# Patient Record
Sex: Female | Born: 1935 | Race: White | Hispanic: No | Marital: Married | Smoking: Never smoker
Health system: Southern US, Community
[De-identification: ages and names within clinical notes are randomized; demographics above are authoritative.]

## PROBLEM LIST (undated history)

## (undated) DIAGNOSIS — I639 Cerebral infarction, unspecified: Secondary | ICD-10-CM

## (undated) DIAGNOSIS — I480 Paroxysmal atrial fibrillation: Secondary | ICD-10-CM

## (undated) DIAGNOSIS — N390 Urinary tract infection, site not specified: Secondary | ICD-10-CM

## (undated) DIAGNOSIS — E785 Hyperlipidemia, unspecified: Secondary | ICD-10-CM

## (undated) DIAGNOSIS — E236 Other disorders of pituitary gland: Secondary | ICD-10-CM

## (undated) DIAGNOSIS — T7840XA Allergy, unspecified, initial encounter: Secondary | ICD-10-CM

## (undated) DIAGNOSIS — G919 Hydrocephalus, unspecified: Secondary | ICD-10-CM

## (undated) DIAGNOSIS — M1990 Unspecified osteoarthritis, unspecified site: Secondary | ICD-10-CM

## (undated) DIAGNOSIS — Z8673 Personal history of transient ischemic attack (TIA), and cerebral infarction without residual deficits: Secondary | ICD-10-CM

## (undated) DIAGNOSIS — F0390 Unspecified dementia without behavioral disturbance: Secondary | ICD-10-CM

## (undated) HISTORY — DX: Unspecified osteoarthritis, unspecified site: M19.90

## (undated) HISTORY — DX: Hyperlipidemia, unspecified: E78.5

## (undated) HISTORY — DX: Hydrocephalus, unspecified: G91.9

## (undated) HISTORY — DX: Personal history of transient ischemic attack (TIA), and cerebral infarction without residual deficits: Z86.73

## (undated) HISTORY — PX: PATELLA FRACTURE SURGERY: SHX735

## (undated) HISTORY — PX: SPINE SURGERY: SHX786

## (undated) HISTORY — DX: Urinary tract infection, site not specified: N39.0

## (undated) HISTORY — PX: TIBIA FRACTURE SURGERY: SHX806

## (undated) HISTORY — DX: Allergy, unspecified, initial encounter: T78.40XA

## (undated) HISTORY — DX: Other disorders of pituitary gland: E23.6

## (undated) HISTORY — DX: Unspecified dementia, unspecified severity, without behavioral disturbance, psychotic disturbance, mood disturbance, and anxiety: F03.90

## (undated) HISTORY — DX: Cerebral infarction, unspecified: I63.9

## (undated) HISTORY — PX: HUMERUS SURGERY: SHX672

---

## 2008-09-07 ENCOUNTER — Inpatient Hospital Stay: Payer: Self-pay | Admitting: Orthopedic Surgery

## 2008-09-24 ENCOUNTER — Emergency Department: Payer: Self-pay | Admitting: Unknown Physician Specialty

## 2010-08-13 ENCOUNTER — Emergency Department: Payer: Self-pay | Admitting: Emergency Medicine

## 2010-10-19 ENCOUNTER — Ambulatory Visit: Payer: Self-pay | Admitting: Family Medicine

## 2010-12-17 ENCOUNTER — Ambulatory Visit: Payer: Self-pay

## 2011-05-12 ENCOUNTER — Ambulatory Visit: Payer: Self-pay

## 2011-05-12 LAB — CREATININE, SERUM
Creatinine: 0.9 mg/dL (ref 0.60–1.30)
EGFR (African American): >60
EGFR (Non-African Amer.): >60

## 2011-12-28 ENCOUNTER — Emergency Department: Payer: Self-pay

## 2011-12-28 LAB — URINALYSIS, COMPLETE
Hyaline Cast: 36
Leukocyte Esterase: NEGATIVE
Nitrite: NEGATIVE
Protein: 30
WBC UR: 8 /HPF (ref 0–5)

## 2011-12-28 LAB — COMPREHENSIVE METABOLIC PANEL
Bilirubin,Total: 1.2 mg/dL — ABNORMAL HIGH (ref 0.2–1.0)
Calcium, Total: 8.6 mg/dL (ref 8.5–10.1)
Chloride: 107 mmol/L (ref 98–107)
Co2: 26 mmol/L (ref 21–32)
Creatinine: 0.94 mg/dL (ref 0.60–1.30)
EGFR (African American): >60
EGFR (Non-African Amer.): 57 — ABNORMAL LOW
SGOT(AST): 24 U/L (ref 15–37)
SGPT (ALT): 29 U/L (ref 12–78)

## 2011-12-28 LAB — AMMONIA: Ammonia, Plasma: <25 mcmol/L (ref 11–32)

## 2011-12-28 LAB — CBC
HCT: 44.7 % (ref 35.0–47.0)
HGB: 15 g/dL (ref 12.0–16.0)
MCH: 30.2 pg (ref 26.0–34.0)
MCHC: 33.6 g/dL (ref 32.0–36.0)
MCV: 90 fL (ref 80–100)
Platelet: 159 10*3/uL (ref 150–440)

## 2011-12-28 LAB — CK TOTAL AND CKMB (NOT AT ARMC): CK, Total: 163 U/L (ref 21–215)

## 2011-12-30 LAB — URINE CULTURE

## 2012-01-02 LAB — CULTURE, BLOOD (SINGLE)

## 2012-05-24 ENCOUNTER — Ambulatory Visit: Payer: Self-pay | Admitting: Orthopedic Surgery

## 2012-05-31 ENCOUNTER — Ambulatory Visit: Payer: Self-pay | Admitting: Orthopedic Surgery

## 2012-09-12 ENCOUNTER — Emergency Department: Payer: Self-pay | Admitting: Internal Medicine

## 2012-09-12 LAB — CBC WITH DIFFERENTIAL/PLATELET
Basophil #: 0.1 x10 3/mm 3
Basophil %: 1.2 %
Eosinophil #: 0.2 x10 3/mm 3
Eosinophil %: 2.2 %
HCT: 38.5 %
HGB: 13.1 g/dL
Lymphocyte %: 43.5 %
Lymphs Abs: 3.5 x10 3/mm 3
MCH: 29.5 pg
MCHC: 33.9 g/dL
MCV: 87 fL
Monocyte #: 0.9 "x10 3/mm "
Monocyte %: 10.8 %
Neutrophil #: 3.4 x10 3/mm 3
Neutrophil %: 42.3 %
Platelet: 177 x10 3/mm 3
RBC: 4.42 X10 6/mm 3
RDW: 14.2 %
WBC: 8 x10 3/mm 3

## 2012-09-12 LAB — COMPREHENSIVE METABOLIC PANEL
Anion Gap: 7 (ref 7–16)
BUN: 15 mg/dL (ref 7–18)
Bilirubin,Total: 1 mg/dL (ref 0.2–1.0)
Chloride: 108 mmol/L — ABNORMAL HIGH (ref 98–107)
Co2: 26 mmol/L (ref 21–32)
Creatinine: 0.99 mg/dL (ref 0.60–1.30)
EGFR (African American): >60
EGFR (Non-African Amer.): 53 — ABNORMAL LOW
Glucose: 130 mg/dL — ABNORMAL HIGH (ref 65–99)
Osmolality: 284 (ref 275–301)
SGPT (ALT): 20 U/L (ref 12–78)
Sodium: 141 mmol/L (ref 136–145)
Total Protein: 6.4 g/dL (ref 6.4–8.2)

## 2012-09-12 LAB — CK TOTAL AND CKMB (NOT AT ARMC): CK, Total: 30 U/L (ref 21–215)

## 2012-09-12 LAB — TROPONIN I
Troponin-I: <0.02 ng/mL
Troponin-I: <0.02 ng/mL

## 2012-09-12 LAB — TSH: Thyroid Stimulating Horm: <0.01 u[IU]/mL — ABNORMAL LOW

## 2012-09-12 LAB — T4, FREE: Free Thyroxine: 1.59 ng/dL — ABNORMAL HIGH

## 2012-11-27 ENCOUNTER — Emergency Department: Payer: Self-pay | Admitting: Emergency Medicine

## 2012-11-27 LAB — BASIC METABOLIC PANEL
Anion Gap: 6 — ABNORMAL LOW (ref 7–16)
BUN: 19 mg/dL — ABNORMAL HIGH (ref 7–18)
Co2: 27 mmol/L (ref 21–32)
Creatinine: 0.82 mg/dL (ref 0.60–1.30)
Osmolality: 280 (ref 275–301)
Potassium: 3.3 mmol/L — ABNORMAL LOW (ref 3.5–5.1)

## 2012-11-27 LAB — CBC
HCT: 39.1 % (ref 35.0–47.0)
MCHC: 34.4 g/dL (ref 32.0–36.0)
MCV: 87 fL (ref 80–100)
Platelet: 168 10*3/uL (ref 150–440)
RBC: 4.49 10*6/uL (ref 3.80–5.20)
RDW: 14.6 % — ABNORMAL HIGH (ref 11.5–14.5)

## 2012-11-27 LAB — URINALYSIS, COMPLETE
Bacteria: NONE SEEN
Glucose,UR: NEGATIVE mg/dL (ref 0–75)
Ketone: NEGATIVE
Ph: 6 (ref 4.5–8.0)
Protein: NEGATIVE
RBC,UR: 4 /HPF (ref 0–5)
Squamous Epithelial: 1

## 2014-02-18 ENCOUNTER — Ambulatory Visit: Payer: Self-pay

## 2014-02-25 DIAGNOSIS — F0280 Dementia in other diseases classified elsewhere without behavioral disturbance: Secondary | ICD-10-CM | POA: Insufficient documentation

## 2014-02-25 DIAGNOSIS — E039 Hypothyroidism, unspecified: Secondary | ICD-10-CM | POA: Insufficient documentation

## 2014-02-25 DIAGNOSIS — E230 Hypopituitarism: Secondary | ICD-10-CM | POA: Insufficient documentation

## 2014-02-25 DIAGNOSIS — I9589 Other hypotension: Secondary | ICD-10-CM | POA: Insufficient documentation

## 2014-02-25 DIAGNOSIS — G309 Alzheimer's disease, unspecified: Secondary | ICD-10-CM

## 2014-05-06 ENCOUNTER — Inpatient Hospital Stay
Admit: 2014-05-06 | Disposition: A | Discharge status: Home / Self Care | Payer: Self-pay | Attending: Internal Medicine | Admitting: Internal Medicine

## 2014-05-06 ENCOUNTER — Other Ambulatory Visit: Payer: Self-pay | Admitting: Physician Assistant

## 2014-05-06 DIAGNOSIS — R4182 Altered mental status, unspecified: Secondary | ICD-10-CM

## 2014-05-06 DIAGNOSIS — I4891 Unspecified atrial fibrillation: Secondary | ICD-10-CM

## 2014-05-06 DIAGNOSIS — I517 Cardiomegaly: Secondary | ICD-10-CM | POA: Diagnosis not present

## 2014-05-06 LAB — URINALYSIS, COMPLETE
BLOOD: NEGATIVE
Bacteria: NONE SEEN
Bilirubin,UR: NEGATIVE
Glucose,UR: NEGATIVE mg/dL (ref 0–75)
Leukocyte Esterase: NEGATIVE
Nitrite: NEGATIVE
PH: 6 (ref 4.5–8.0)
Protein: 30
Specific Gravity: 1.015 (ref 1.003–1.030)

## 2014-05-06 LAB — BASIC METABOLIC PANEL
Anion Gap: 12 (ref 7–16)
BUN: 10 mg/dL
CHLORIDE: 103 mmol/L
Calcium, Total: 9 mg/dL
Co2: 20 mmol/L — ABNORMAL LOW
Creatinine: 0.95 mg/dL
EGFR (Non-African Amer.): 56 — ABNORMAL LOW
GLUCOSE: 106 mg/dL — AB
POTASSIUM: 3.3 mmol/L — AB
Sodium: 135 mmol/L

## 2014-05-06 LAB — CK TOTAL AND CKMB (NOT AT ARMC)
CK, Total: 18 U/L — ABNORMAL LOW
CK, Total: 34 U/L — ABNORMAL LOW
CK-MB: 2.3 ng/mL
CK-MB: 2.9 ng/mL

## 2014-05-06 LAB — CBC
HCT: 40.5 % (ref 35.0–47.0)
HGB: 12.7 g/dL (ref 12.0–16.0)
MCH: 26.2 pg (ref 26.0–34.0)
MCHC: 31.4 g/dL — AB (ref 32.0–36.0)
MCV: 83 fL (ref 80–100)
Platelet: 381 10*3/uL (ref 150–440)
RBC: 4.85 10*6/uL (ref 3.80–5.20)
RDW: 18.4 % — AB (ref 11.5–14.5)
WBC: 9.1 10*3/uL (ref 3.6–11.0)

## 2014-05-06 LAB — HEPATIC FUNCTION PANEL A (ARMC)
ALT: 12 U/L — AB
Albumin: 3.4 g/dL — ABNORMAL LOW
Alkaline Phosphatase: 122 U/L
BILIRUBIN INDIRECT: 0.6
BILIRUBIN TOTAL: 0.8 mg/dL
Bilirubin, Direct: 0.2 mg/dL
SGOT(AST): 27 U/L
Total Protein: 6.7 g/dL

## 2014-05-06 LAB — TROPONIN I
Troponin-I: <0.03 ng/mL
Troponin-I: <0.03 ng/mL
Troponin-I: <0.03 ng/mL

## 2014-05-07 ENCOUNTER — Encounter: Payer: Self-pay | Admitting: Physician Assistant

## 2014-05-07 DIAGNOSIS — I4891 Unspecified atrial fibrillation: Secondary | ICD-10-CM | POA: Diagnosis not present

## 2014-05-07 DIAGNOSIS — R079 Chest pain, unspecified: Secondary | ICD-10-CM | POA: Diagnosis not present

## 2014-05-07 LAB — CBC WITH DIFFERENTIAL/PLATELET
BASOS ABS: 0.1 10*3/uL (ref 0.0–0.1)
Basophil %: 0.9 %
Eosinophil #: 0 10*3/uL (ref 0.0–0.7)
Eosinophil %: 0.6 %
HCT: 29.9 % — AB (ref 35.0–47.0)
HGB: 9.4 g/dL — ABNORMAL LOW (ref 12.0–16.0)
Lymphocyte #: 1.6 10*3/uL (ref 1.0–3.6)
Lymphocyte %: 22.5 %
MCH: 26.6 pg (ref 26.0–34.0)
MCHC: 31.6 g/dL — AB (ref 32.0–36.0)
MCV: 84 fL (ref 80–100)
Monocyte #: 0.6 x10 3/mm (ref 0.2–0.9)
Monocyte %: 8.8 %
Neutrophil #: 4.8 10*3/uL (ref 1.4–6.5)
Neutrophil %: 67.2 %
Platelet: 291 10*3/uL (ref 150–440)
RBC: 3.55 10*6/uL — ABNORMAL LOW (ref 3.80–5.20)
RDW: 18.2 % — ABNORMAL HIGH (ref 11.5–14.5)
WBC: 7.2 10*3/uL (ref 3.6–11.0)

## 2014-05-07 LAB — MAGNESIUM: Magnesium: 1.7 mg/dL

## 2014-05-07 LAB — BASIC METABOLIC PANEL
ANION GAP: 3 — AB (ref 7–16)
BUN: 6 mg/dL
CHLORIDE: 111 mmol/L
Calcium, Total: 7.8 mg/dL — ABNORMAL LOW
Co2: 21 mmol/L — ABNORMAL LOW
Creatinine: 0.45 mg/dL
EGFR (African American): >60
EGFR (Non-African Amer.): >60
GLUCOSE: 106 mg/dL — AB
POTASSIUM: 4.1 mmol/L
Sodium: 135 mmol/L

## 2014-05-07 LAB — TSH: Thyroid Stimulating Horm: 0.026 u[IU]/mL — ABNORMAL LOW

## 2014-05-07 LAB — LIPID PANEL
Cholesterol: 106 mg/dL
HDL: 29 mg/dL — AB
LDL CHOLESTEROL, CALC: 55 mg/dL
Triglycerides: 109 mg/dL
VLDL CHOLESTEROL, CALC: 22 mg/dL

## 2014-05-07 LAB — T4, FREE: FREE THYROXINE: 0.78 ng/dL

## 2014-05-08 DIAGNOSIS — I4891 Unspecified atrial fibrillation: Secondary | ICD-10-CM | POA: Diagnosis not present

## 2014-05-08 LAB — CBC WITH DIFFERENTIAL/PLATELET
BASOS ABS: 0 10*3/uL (ref 0.0–0.1)
BASOS PCT: 0.4 %
EOS PCT: 0.4 %
Eosinophil #: 0 10*3/uL (ref 0.0–0.7)
HCT: 29.1 % — ABNORMAL LOW (ref 35.0–47.0)
HGB: 9.5 g/dL — AB (ref 12.0–16.0)
LYMPHS ABS: 1.7 10*3/uL (ref 1.0–3.6)
Lymphocyte %: 21.3 %
MCH: 27 pg (ref 26.0–34.0)
MCHC: 32.6 g/dL (ref 32.0–36.0)
MCV: 83 fL (ref 80–100)
MONOS PCT: 5.9 %
Monocyte #: 0.5 x10 3/mm (ref 0.2–0.9)
NEUTROS ABS: 5.6 10*3/uL (ref 1.4–6.5)
Neutrophil %: 72 %
Platelet: 301 10*3/uL (ref 150–440)
RBC: 3.52 10*6/uL — ABNORMAL LOW (ref 3.80–5.20)
RDW: 18 % — AB (ref 11.5–14.5)
WBC: 7.8 10*3/uL (ref 3.6–11.0)

## 2014-05-08 LAB — BASIC METABOLIC PANEL
ANION GAP: 8 (ref 7–16)
BUN: 5 mg/dL — AB
CHLORIDE: 108 mmol/L
Calcium, Total: 8.1 mg/dL — ABNORMAL LOW
Co2: 20 mmol/L — ABNORMAL LOW
Creatinine: 0.41 mg/dL — ABNORMAL LOW
EGFR (Non-African Amer.): >60
Glucose: 109 mg/dL — ABNORMAL HIGH
POTASSIUM: 3.8 mmol/L
Sodium: 136 mmol/L

## 2014-05-08 LAB — MAGNESIUM: Magnesium: 1.6 mg/dL — ABNORMAL LOW

## 2014-05-08 LAB — URINE CULTURE

## 2014-05-09 LAB — CBC WITH DIFFERENTIAL/PLATELET
BASOS PCT: 0.5 %
Basophil #: 0 10*3/uL (ref 0.0–0.1)
EOS PCT: 1.1 %
Eosinophil #: 0.1 10*3/uL (ref 0.0–0.7)
HCT: 31.4 % — ABNORMAL LOW (ref 35.0–47.0)
HGB: 10 g/dL — AB (ref 12.0–16.0)
LYMPHS PCT: 28.4 %
Lymphocyte #: 2.4 10*3/uL (ref 1.0–3.6)
MCH: 26.4 pg (ref 26.0–34.0)
MCHC: 32 g/dL (ref 32.0–36.0)
MCV: 83 fL (ref 80–100)
MONOS PCT: 8.9 %
Monocyte #: 0.8 x10 3/mm (ref 0.2–0.9)
NEUTROS PCT: 61.1 %
Neutrophil #: 5.2 10*3/uL (ref 1.4–6.5)
PLATELETS: 342 10*3/uL (ref 150–440)
RBC: 3.8 10*6/uL (ref 3.80–5.20)
RDW: 18.3 % — ABNORMAL HIGH (ref 11.5–14.5)
WBC: 8.5 10*3/uL (ref 3.6–11.0)

## 2014-05-09 LAB — BASIC METABOLIC PANEL
Anion Gap: 8 (ref 7–16)
BUN: 8 mg/dL
CALCIUM: 8.3 mg/dL — AB
CREATININE: 0.45 mg/dL
Chloride: 106 mmol/L
Co2: 24 mmol/L
EGFR (African American): >60
GLUCOSE: 93 mg/dL
Potassium: 3.5 mmol/L
SODIUM: 138 mmol/L

## 2014-05-09 LAB — MAGNESIUM: Magnesium: 1.7 mg/dL

## 2014-05-09 NOTE — Op Note (Signed)
PATIENT NAME:  Natalie Rollins, Natalie Rollins MR#:  [id number] DATE OF BIRTH:  03-Jul-1929  DATE OF PROCEDURE:  05/31/2012  PREOPERATIVE DIAGNOSIS: Painful hardware, left patella.   POSTOPERATIVE DIAGNOSIS: Painful hardware, left patella.  PROCEDURE PERFORMED: Deep hardware removal, left patella.   SURGEON: Leitha SchullerMichael J. Bartow Zylstra, MD  ANESTHESIA:  General.   DESCRIPTION OF PROCEDURE: The patient was brought to the operating room, and after adequate anesthesia was obtained the left leg was prepped and draped in the usual sterile fashion. Appropriate patient identification and timeout procedures were completed. The tourniquet was raised to 300 mmHg, and the prior midline skin incision was partially opened, not having to use the entire extent of the prior incision. Initially, the tension band wire was identified, the knot loosened and then the wire removed without difficulty. It had broken at the superior aspect and came out easier because of this. The K-wire on the medial aspect was straightened proximally and could be brought out distally through a small incision in the patellar tendon. The more lateral K-wire also had broken and was pulled out proximally and distally as individual fragments. There was bursitis present over the implants.  A mini-C-arm was brought in and revealed no remaining hardware and healed fracture. At this point, the wound was irrigated and injected with 10 mL of 0.5% Sensorcaine without epinephrine to aid in postoperative analgesia. The wound was closed with 2-0 Vicryl subcutaneously and skin staples with a #1 Vicryl in the areas where the tendon had been split to remove the hardware. Sterile dressing of Xeroform, 4 x 4, Webril, and Ace wrap were applied and the patient sent to the recovery room in stable condition.   ESTIMATED BLOOD LOSS: Minimal.   COMPLICATIONS: None.   SPECIMEN: None.   HARDWARE: Discarded after photographing it.   TOURNIQUET TIME: 22 minutes at 300 mmHg.     ____________________________ Leitha SchullerMichael J. Shaunte Weissinger, MD mjm:cb D: 05/31/2012 21:21:21 ET T: 05/31/2012 21:33:57 ET JOB#: [id number]  cc: Leitha SchullerMichael J. Caya Soberanis, MD, <Dictator> Leitha SchullerMICHAEL J Quinta Eimer MD ELECTRONICALLY SIGNED 06/01/2012 8:06

## 2014-05-11 LAB — CULTURE, BLOOD (SINGLE)

## 2014-05-18 NOTE — Consult Note (Signed)
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  ______________   PMH:  1. Recently diagnosed new onset a-fib in [place] (did not yet start full dose anticoagulation) in March 2016  2. Right stroke in right basal ganglia 2013  3. History of chronic baseline pituitary meningioma with panhypopituitarism status post VP shunt  4. Transient history of atrial fibrillation post surgery 08/03/2010  5. History of left internal capsule CVA 07/2010  6. Hypothyroidism  7. HLD  8. Arthritis (status post lumbar fusion complicated by broken hardware with removal of the fusion and subsequent revision most recently 07/2010)   Past Surgical History:  1. VP shunt placement.  2. Multiple lumbar surgeries.  ______________   Present Illness 81 year old female with the above complex problem list who presented to [facility] on 4/19 with the above CC.  She and her husband live both in [place] and [place], [place]. She has history a brief episode of post-op a-fib back in 07/2010. At that time she was not placed on long term full dose anticoagulation. No prior ischemic evaluations.   She has a history of intermittent unresponsiveness. Her husband sometimes calls EMS and sometimes doesn't, based on her presentation. In the setting of most  recent stroke in 2013 echo showed normal LV/RV function, mild MR, trivial TR, negative saline bubble study. Her aspirin was increased from 81 mg daily to 325 mg daily at that time.     While in [place] in March she developed another unresponsive episode and was taken to the hospital. She was diagnosed with new onset a-fib with RVR. Her husband reports she was treated with Cardizem CD 120 mg, Multaq, and Eliquis. Husband does not know if she was dsicharged in NSR or a-fib. She was discharged to rehab x 1 week. Upon being dicharged from rehab she was sent home with 6 prescriptions that were taken to [facility]. Only 4 have been filled 2/2 insurance policy. At home she is not on long term full dose anticoagulation, Cardizem CD, or Multaq. She has been taking aspirin daily.   She has been less responsive than usual, not oriented, and generally more fatigued over the past 2 days and has been moaning a lot. Her husband reports she usually sleeps approximately 20 or so hours daily at baseline. At baseline she is usually able to ambulate with assistance, however over the past couple of days she has been quite unsteady on her feet. This is how she has previously presented with her UTIs so she began to take some cipro (total of 4 tabs prior to admission). This morning, her husband took her to the bathroom and while she was sitting on the toilet her head slumped downward to her chest. She appeared to be in respiratory distress and not responsive. When he lifted her chin, she was able to breathe easily, but was still not responding to him. He  called EMS, she was brought to the Emergency Room. On presentation, her heart rate is between 140 and 160. She is in a-fib with RVR. She received IV diltiazem 10 mg x 1 without much change in HR. She has placed on diltiazem gtt at 10 mg/hr. She remains in a-fib with RVR with HR in the 120s. Labs show K+ 3.3, troponin negative x 1, CXR no acute process, CT head no acute process. BP has been  soft limiting titration of medication.   Physical Exam:  GEN critically ill appearing   HEENT PERRL, hearing intact to voice, moist oral mucosa   NECK supple  trachea midline  no JVD   RESP normal resp effort  limited breath sounds   CARD Irregular rate and rhythm  Tachycardic  No murmur   ABD denies tenderness  soft   LYMPH negative neck   EXTR negative edema   SKIN normal to palpation   NEURO unable to test   [facility] lethargic   Review of Systems:  ROS Pt not able to provide ROS  lethargy   Medications/Allergies Reviewed Medications/Allergies reviewed   Family & Social History:  Family and Social History:  Family History unable to obtain 2/2 AMS   Social History negative tobacco, negative ETOH, negative Illicit drugs   Place of Living Home  lives with husband     Atrial Fibrillation:    Urinary Tract Infection:    Hypothyroidism:    tia:    brain tumor:    hydrocephalus:    Knee Surgery - Left:    Ankle Surgery - Right:    right leg sugery:    rods in back:    shunt in brain:   Home Medications: Medication Instructions Status  Norco 325 mg-5 mg oral tablet 1 tab(s) orally every 4 hours, As Needed - for Pain Active  hydrocortisone 10 mg oral tablet 3 tab(s) orally once a day Active  Multaq 400 mg oral tablet 1 tab(s) orally 2 times a day (with meals) Active  diltiazem 12 hour extended release 120 mg/24 hours oral capsule, extended release 1 cap(s) orally once a day Active  sodium bicarbonate 650 mg oral tablet 1 tab(s) orally 2 times a day Active  potassium chloride extended release 10 mEq oral tablet, extended release 1 tab(s) orally once a day Active  atorvastatin 10 mg oral tablet 1 tab(s) orally once a day (at bedtime) Active  levothyroxine 88 mcg (0.088 mg) oral tablet 1 tab(s) orally once a day (in the morning) Active  desmopressin 0.1 mg oral tablet 1 tab(s) orally 2 times a day Active  multivitamin 1 tab(s) orally once a day (in the  morning) Active  Vitamin C 1000 mg oral tablet 1 tab(s) orally once a day (in the morning) Active  calcium, zinc, magnesium, and vitamin d 1 tab(s) orally once a day (at bedtime) Active  vitamin E 400 intl units oral capsule 1 cap(s) orally once a day (at bedtime) Active  chondroitin-glucosamine 1 tab(s) orally 2 times a day Active  aspirin 81 mg oral tablet 1 tab(s) orally 4 times a day Active   Lab Results:  Routine Chem:  19-Apr-16 08:53   Glucose, Serum  106 (65-99 NOTE: New Reference Range  03/25/14)  BUN 10 (6-20 NOTE: New Reference Range  03/25/14)  Creatinine (comp) 0.95 (0.44-1.00 NOTE: New Reference Range  03/25/14)  Sodium, Serum 135 (135-145 NOTE: New Reference Range  03/25/14)  Potassium, Serum  3.3 (3.5-5.1 NOTE: New Reference Range  03/25/14)  Chloride, Serum 103 (101-111 NOTE: New Reference Range  03/25/14)  CO2, Serum  20 (22-32 NOTE: New Reference Range  03/25/14)  Calcium (Total), Serum 9.0 (8.9-10.3 NOTE: New Reference Range  03/25/14)  Anion Gap 12  eGFR (African American) >60  eGFR (Non-African American)  56 (eGFR values <37m/min/1.73 m2 may be an indication of chronic kidney disease (CKD). Calculated eGFR is useful in patients with stable renal function. The eGFR calculation will not be reliable in acutely ill patients when serum creatinine is changing rapidly. It is not useful in patients on dialysis. The eGFR calculation may not be applicable to patients at the low and high extremes of body sizes, pregnant women, and vegetarians.)  Cardiac:  19-Apr-16 08:53   Troponin I <0.03 (0.00-0.03 0.03 ng/mL or less: NEGATIVE  Repeat testing in 3-6 hrs  if clinically indicated. >0.05 ng/mL: POTENTIAL  MYOCARDIAL INJURY. Repeat  testing in 3-6 hrs if  clinically indicated. NOTE: An increase or decrease  of 30% or more on serial  testing suggests a  clinically important change NOTE: New Reference Range  03/25/14)  Routine UA:  19-Apr-16 08:53    Color (UA) Yellow  Clarity (UA) Clear  Glucose (UA) Negative  Bilirubin (UA) Negative  Ketones (UA) 1+  Specific Gravity (UA) 1.015  Blood (UA) Negative  pH (UA) 6.0  Protein (UA) 30 mg/dL  Nitrite (UA) Negative  Leukocyte Esterase (UA) Negative (Result(s) reported on 06 May 2014 at 09:36AM.)  RBC (UA) 0-5  WBC (UA) 0-5  Bacteria (UA) NONE SEEN  Epithelial Cells (UA) 0-5  Mucous (UA) PRESENT  Hyaline Cast (UA) PRESENT (Result(s) reported on 06 May 2014 at 09:36AM.)  Routine Hem:  19-Apr-16 08:53   WBC (CBC) 9.1  RBC (CBC) 4.85  Hemoglobin (CBC) 12.7  Hematocrit (CBC) 40.5  Platelet Count (CBC) 381 (Result(s) reported on 06 May 2014 at 09:15AM.)  MCV 83  MCH 26.2  MCHC  31.4  RDW  18.4   EKG:  Interpretation EKG shows atrial fibrillation with RVR   Radiology Results: XRay:    19-Apr-16 09:51, Chest Portable Single View  Chest Portable Single View   REASON FOR EXAM:    ALTERED MENTAL STATUS, RAPID HEART RATE  COMMENTS:       PROCEDURE: DXR - DXR PORTABLE CHEST SINGLE VIEW  - May 06 2014  9:51AM     CLINICAL DATA:  Altered mental status. Tachycardia. Rapid heart  rate. Generalized weakness. Atrial fibrillation.    EXAM:  PORTABLE CHEST - 1 VIEW    COMPARISON:  11/17/2012.  09/12/2012.    FINDINGS:  Shunt tubing projects over the RIGHT chest. Thoracolumbar rod and  screw fixation. Proximal RIGHT humerus fracture with ossified  callus.    Density is present at the RIGHT cardiophrenic angle which is  probably due to pericardial fat pad along with rotation. Compared to  prior exams, the position and projection of the thoracic spine  fixation hardware is compatible with rotation of the patient. No  airspace consolidation. Old right-sided rib fractures. Monitoring  leads project over the chest.     IMPRESSION:  1. No acute cardiopulmonary disease.  2. RIGHT chest shunt tubing.  Thoracolumbar fixation hardware.  3. Healing proximal RIGHT humerus  fracture.  Electronically Signed    By: GDereck LigasM.D.    On: 05/06/2014 10:20         Verified By: GMelvyn Novas M.D.,  CT:    19-Apr-16 09:43, CT Head Without Contrast  CT Head  Without Contrast   REASON FOR EXAM:    ALTERED MENTAL STATUS  COMMENTS:       PROCEDURE: CT  - CT HEAD WITHOUT CONTRAST  - May 06 2014  9:43AM     CLINICAL DATA:  Altered mental status, syncope    EXAM:  CT HEAD WITHOUT CONTRAST    TECHNIQUE:  Contiguous axial images were obtained from the base of the skull  through the vertex without intravenous contrast.    COMPARISON:  09/12/2012  FINDINGS:  No skull fracture is noted. A ventricular shunt catheter with right  parietal approach is unchanged in position. Persistent dense  calcification in suprasellar region stable from prior exam. A  skullbased small calcified meningioma left posterior parietal region  axial image 15, measures 6 mm stable in size in appearance from  prior exam.    No intracranial hemorrhage, mass effect or midline shift. Stable  cerebral atrophy. Ventricular size is stable from prior exam. No  intraventricular hemorrhage. Stable periventricular and patchy  subcortical chronic white matter disease. No definite acute cortical  infarction.     IMPRESSION:  A ventricular shunt catheter with right parietal approach is  unchanged in position. Persistent dense calcification in suprasellar  region stable from prior exam. A skullbased small calcified  meningioma left posterior parietal region axial image 15, measures 6  mm stable in size in appearance from prior exam.    No intracranial hemorrhage, mass effect or midline shift. Stable  cerebral atrophy. Ventricular size is stable from prior exam. No  intraventricular hemorrhage. Stable periventricular and patchy  subcortical chronic white matter disease. No definite acute cortical  infarction.      Electronically Signed    By: Lahoma Crocker M.D.    On: 05/06/2014 09:51          Verified By: Ephraim Hamburger, M.D.,    No Known Allergies:   Vital Signs/Nurse's Notes: **Vital Signs.:   19-Apr-16 11:34  Vital Signs Type Admission  Temperature Temperature (F) 97.7  Celsius 36.5  Temperature Source axillary  Pulse Pulse 152  Respirations Respirations 19  Systolic BP Systolic BP 77  Diastolic BP (mmHg) Diastolic BP (mmHg) 64  Mean BP 68  Pulse Ox % Pulse Ox % 95  Pulse Ox Activity Level  At rest  Oxygen Delivery Room Air/ 21 %    Impression 81 year old female with history of recently diagnosed new onset a-fib in [place] (did not yet start full dose anticoagulation) in March 2016, right stroke in right basal ganglia 2013, 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  1. A-fib: -Unknown if persistent a-fib vs PAF (requested records from [facility]). Her husband does not know if she was discharged in NSR vs a-fib in March, unable to determine if she recently went into a-fib vs has been in for >48 hours -Not on long term full dose anticoagulation -Would rate control, goal <100 bpm -Blood pressure improving this afternoon as her heart rate seems to be slowing down, initially somewhat soft limiting titration of gtt or addition of beta blocker -Add digoxin 0.25 mg (heart rate improving to  the low 100s to 1-teens this afternoon) -Hold antiarrhythmics as we do not know how long she has been in a-fib (has not been taking Multaq as an outpatient) -Echo is pending, prelim report is normal LV function from echo  tech -CHADSVASc at least 5 giving her an estimated annual stroke risk of 6.7%. She has had multiple strokes previously. She would be at increased risk of stroke without long term anticoagulation -There appears to be an issue with their insurance and Eliquis. Will consult case manager today and have them investigate the best possible NOAC for their insurance. On subcutaneous heparin -K+ 3.3 upon admission-->replete  2. AMS: -Husband reports symptoms similar to prior UTI -Frequent issue for her -CT head without acute pathology -MRI brain pending -TSH and cultures pending  3. Hypokalemia: -Replete to 4.0 -Check Mg  4. Panhypopituitarism:  -Continue current medications  5. HLD: -Statin   Electronic Signatures: Rise Mu (PA-C)  (Signed 19-Apr-16 14:59)  Authored: General Aspect/Present Illness, History and Physical Exam, Review of System, Family & Social History, Past Medical History, Home Medications, Labs, EKG , Radiology, Allergies, Vital Signs/Nurse's Notes, Impression/Plan Ida Rogue (MD)  (Signed 19-Apr-16 17:59)  Authored: General Aspect/Present Illness, History and Physical Exam, EKG  Co-Signer: General Aspect/Present Illness, History and Physical Exam, Review of System, Family & Social History, Past Medical History, Home Medications, Labs, EKG , Radiology, Allergies, Vital Signs/Nurse's Notes, Impression/Plan   Last Updated: 19-Apr-16 17:59 by Ida Rogue (MD)

## 2014-05-18 NOTE — H&P (Signed)
PATIENT NAME:  Natalie Rollins, [name] MR#:  [id number] DATE OF BIRTH:  26-Sep-1929  DATE OF ADMISSION:  05/06/2014  REFERRING EMERGENCY ROOM PHYSICIAN:  Natalie Rollins, M.D.  PRIMARY CARE DOCTOR:  Natalie SizerMark A. Crissman, MD  CHIEF COMPLAINT: Syncope.   HISTORY OF PRESENT ILLNESS: This 81 year old woman with past medical history of atrial fibrillation, hypothyroidism, placement of a VP shunt in 1999 due to hydrocephalus after surgery for meningioma and possible history of stroke, presents today with a syncopal event. The history is provided by her husband as the patient is a poor historian. He reports that for the past 2 days she has been less responsive than usual. At baseline, she sleeps about 20 hours per day and makes a moaning sound throughout the day, but when addressed directly she is oriented and conversant. She is also able to walk with assistance. For the past 2 to 3 days, she has been less responsive, keeping her head down and less oriented. She has also been very unstable on her feet. He states that she has recurrent urinary tract infections and because he thought that she had one for the past 2 days he has been giving her ciprofloxacin twice a day. She has received 4 tablets.  The dose is unknown. This morning, he took her to the bathroom and while she was sitting on the toilet her head slumped downward to her chest.  She appeared to be in respiratory distress and not responsive. When he lifted her chin, she was able to breathe easily, but was still not responding to him. He called EMS, she was brought to the Emergency Room. On presentation, her heart rate is between 140 and 160. She is in atrial fibrillation with rapid ventricular response. She is lethargic, but conversant when addressed directly. She is oriented to person and place, but not to her reasons for being in the hospital. She is being admitted for further evaluation of syncopal event and also for atrial fibrillation with a rapid ventricular  response.   PAST MEDICAL HISTORY:  1.  Atrial fibrillation diagnosed in early March on a trip to [place]. She is not currently on anticoagulation because her insurance does not cover Eliquis. She has been taking aspirin 81 mg daily.  2.  Hypothyroidism.  3.  Hyperlipidemia.  4.  VP shunt for hydrocephalus after surgery to remove a meningioma.  5.  Hyperlipidemia.  6.  Possible history of stroke with no focal neurologic defects.  PAST SURGICAL HISTORY:  1.  Placement of VP shunt 1999.  2.  Back surgery x 4 to insert and repair rods for scoliosis.  3.  Left patellar fracture repair.  4.  Right tibial fracture repair.  5.  Right shoulder repair.    SOCIAL HISTORY: She has never been a cigarette smoker. She drinks 1 to 2 alcoholic beverages per week. She is usually able to walk with assistance.  Does not use a cane or walker. She lives with her husband in their own home.  The patient has a living well, but she is a full code.    FAMILY MEDICAL HISTORY: Unable to obtain due to patient altered mental status.   REVIEW OF SYSTEMS:  Unable to obtain due to patient altered mental status.   HOME MEDICATIONS:   1.  Vitamin E 400 units 1 capsule once a day at bedtime.  2.  Vitamin C 1000 mg 1 tablet once a day in the morning.  3.  Sodium bicarbonate 650 mg 1 tablet twice  a day.  4.  Potassium chloride 10 mEq 1 tablet once a day.  5.  Norco 325/5 mg 1 tablet every 4 hours as needed for pain.  6.  Multivitamin 1 tablet daily.  7.  Multaq 400 mg 1 tablet twice a day with meals.  8.  Levothyroxine 88 mcg 1 tablet once a day in the morning.  9.  Hydrocortisone 10 mg 3 tablets once a day.  10.  Diltiazem 12 hour extended release 120 mg 1 tablet once a day.  11. Desmopressin 0.1 mg tablet 1 tablet twice a day.  12.  Chondroitin glucosamine 1 tablet twice a day.  13.  Calcium, zinc, magnesium and vitamin D tablet 1 tablet once a day.  14.  Atorvastatin 10 mg 1 tablet once a day.  15.  Aspirin 81 mg  1 tablet 4 times a day.  ALLERGIES: No known allergies.   PHYSICAL EXAMINATION:  VITAL SIGNS: Temperature 97.4, pulse 142, respirations 18, blood pressure 103/88, oxygenation 95% on room air.  GENERAL: The patient appears critically ill and lethargic. HEENT: Pupils are equal, round, and reactive to light. Extraocular motion is intact. The left eyelid is sticking shut partially and the right eye has thick green discharge in the corner. Conjunctivae are inflamed. Oral mucous membranes are coated in a thick yellowy mucus. Good dentition. Posterior oropharynx seems clear of erythema, but does have this exudative mucus as does the rest of the mouth.  NECK: Supple. Trachea is midline. No cervical lymphadenopathy. Thyroid nontender.  CARDIOVASCULAR: Irregular, tachycardic.  No murmurs, rubs, or gallops. No peripheral edema. Peripheral pulses are 1+.  RESPIRATORY: Lungs are clear to auscultation bilaterally on anterior examination.  She is unable to sit up for the examination due to weakness. No coughing, wheezing, rhonchi, or rales. She does make a moaning sound when she reports is a habit.  ABDOMEN: Soft, nontender, nondistended. No guarding, no rebound, no mass. No hepatosplenomegaly noted.  SKIN: She has very inflamed intertrigo underneath the right breast with no open wounds. No purulent drainage. She has mild intertrigo in the right groin. No other rash, open wounds, or skin changes noted.  MUSCULOSKELETAL: Strength is 5/5 throughout, but she is slow to follow commands.  NEUROLOGIC: Cranial nerves II through XII are grossly intact.  Strength is intact and equal bilaterally. She is very slow to follow commands, but she does respond appropriately.  PSYCHIATRIC: She is alert when directly addressed. She is oriented to person and place. She is able to answer some historical questions accurately.  When not being directly addressed, she seems to be asleep. No signs of uncontrolled anxiety or depression.    RESULTS: Sodium 135, potassium 3.3, chloride 103, bicarbonate 20, BUN 10, creatinine 0.95, glucose 106, calcium 9.0. Troponin less than 0.03. White blood cells 9.1, hemoglobin 12.7, platelets 381,000. MCV is 83. UA is positive for ketones and protein.  Negative for white or red blood cells.   IMAGING:  CT scan of the head without contrast shows ventricular shunt catheter with right parietal approach unchanged in position. Persistent dense calcification in suprasellar region stable from prior examinations. Skull-based small calcified meningioma left posterior parietal region is stable from prior examination.  No intracranial hemorrhage, mass effect, or midline shift. Stable cerebral atrophy. Ventricular size is stable from prior examination.  No intraventricular hemorrhaging.  Stable periventricular and patchy subcortical chronic white matter disease, no definite acute cortical infarction.  Chest x-ray shows no acute cardiopulmonary disease, right chest shunting tube, thoracolumbar fixation hardware,  healing proximal right humerus fracture.  ASSESSMENT AND PLAN:  1.  Atrial fibrillation with rapid ventricular rate: The patient has been started on a diltiazem drip in the Emergency Room and will be admitted to the CCU for continuation of diltiazem drip. Her blood pressure is fairly soft.  If this decreases, will need to switch to amiodarone. I will cycle her cardiac enzymes to ensure she is not having an acute coronary syndrome at the same time. She has not been on anticoagulation as an outpatient, has been taking 4 aspirin 81 tablets daily. She is unable to afford Eliquis at this time. I will request a cardiology consultation. She does not have a local cardiologist.  2.  Altered mental status: The patient's husband states that her current symptoms are very similar to prior urinary tract infections. He has been treating her with ciprofloxacin for 2 days. I will get blood and urine cultures. Will check a TSH  and an MRI. At this point, will hold off on empiric antibiotics as she is not showing any signs of infection, no fever, leukocytosis.  Urinalysis at this time is negative.  Chest x-ray negative. 3.  Hypokalemia: Replace potassium. Check magnesium, recheck, and monitor.  4.  Intertrigo under the right breast. Will start nystatin. Blood cultures pending.  5.  Panhypopituitarism:  She is on desmopressin, levothyroxine, and hydrocortisone. I will continue these medications. Will check a TSH level and a cortisol level due to altered mental status.  6.  Hyperlipidemia: Continue statin.  7.  Patient has a living will.  She is a full code at this time.  Her medical decision maker is her husband Anjolie Majer.    TIME SPENT ON ADMISSION: 45 minutes.   ____________________________ Ena Dawley. Clent Ridges, MD cpw:sp D: 05/06/2014 11:59:05 ET T: 05/06/2014 12:36:56 ET JOB#: [id number]  cc: Santina Evans P. Clent Ridges, MD, <Dictator> Gale Journey MD ELECTRONICALLY SIGNED 05/07/2014 18:47

## 2014-05-18 NOTE — Discharge Summary (Addendum)
PATIENT NAME:  Natalie Rollins, [name] MR#:  [id number] DATE OF BIRTH:  1929/11/20  DATE OF ADMISSION:  05/06/2014 DATE OF DISCHARGE:  05/09/2014  PRIMARY DOCTOR:   Vonita MossMark  Crissman, MD   DISCHARGE DIAGNOSES:  1. Atrial fibrillation with rapid ventricular response, resolved.   2. History of hydrocephalus status post revision. 3. Hypothyroid.  4. Hyperlipidemia. 5. Panhypopituitarism.  6. Meningioma. 7. Hypokalemia.   DISCHARGE MEDICATIONS:  Levothyroxine 88 mcg  p.o. daily, desmopressin 0.1 mg p.o. b.i.d., vitamin E 400 international units daily, chondroitin with glucosamine 1 tablet p.o. b.i.d.,  Cardizem CD 120 mg p.o. daily, magnesium oxide 400 mg p.o. daily, Eliquis 2.5 mg p.o. b.i.d. (new), sodium bicarbonate 1 tablet p.o. b.i.d., hydrocortisone 10 mg p.o. daily.    CONSULTATIONS: Cardiology with Lorine BearsMuhammad Arida, MD.  HOSPITAL COURSE: An 81 year old female  patient admitted to hospital for syncope.   Heart rate about 140-160 and she was in atrial fibrillation with RVR when  she was admitted.   1. The patient had a history hydrocephalus and VP shunt was placed. She had a history of meningioma and she h and developed  noted that she is sleeping whole day as per husband. The patient admitted to ICU per atrial fibrillation with RVR and she was initially started on Cardizem drip and transitioned to p.o. Cardizem and transferred her to telemetry. The patient initially was on Eliquis before she came for atrial fibrillation but she could not take because  insurance  was not approved and the patient was taking only aspirin. The patient was started on full dose Lovenox here in the hospital and [facility], the patient had echocardiogram which showed EF 60-65% with dilated left atrium. The patient did not have chest pain and she felt very well.  Heart rate has been around 80-90 on telemetry. The patient initally monitored in ICU and moved to TEle after her has been better.  patient's mental status became more alert and oriented after fluids and Cardizem. She was seen by physical therapy. They recommended 24 home health physical therapy. The patient has husband as primary caregiver. Arranged home health physical therapy for  her and the patient discharged home and called in Eliquis at 2.5 mg b.i.d. and it is approved by insurance now and she will take Eliquis and she will see Dr. Kirke CorinArida  in 1 week to 10 days. 2. Dementia. The patient has a history of meningioma status post revision with possible stroke after and as well she has some cognitive impairment. The patient was full lethargic at home, but now yesterday during her hospital stay, she was more alert and oriented and infection workup has been negative, so the patient discharged home.  Physical therapy recommended using a rolling walker and husband said they have the device and we discharged her with home health physical therapy and  RN.   3. Panhypopituitarism.  She has levothyroxine, desmopressin, and hydrocortisone  that she takes and we continued on them.  4.Permanent atrial fibrillation. The patient has a CHADS-VASc score at least 5 with annual risk of stroke 6.7% and she had multiple strokes before, so she is at increased risk for stroke without longterm anticoagulation and the patient was given Eliquis.  The patient's lethargy.  She had a workup for urinary tract infection, cultures were negative. MRI of brain did not show acute changes.  the patient advised to follow up with Dr. Dossie Arbourrissman as needed.    PHYSICAL EXAMINATION AT THE TIME OF  DISCHARGE:  CARDIOVASCULAR:   Irregularly irregular.  LUNGS: Clear to auscultation.  ABDOMEN: Soft, nontender, nondistended. Bowel sounds present. NEUROLOGIC: Able to follow commands and alert and oriented.  No focal deficit.  VITALS AT THE TIME OF DISCHARGE:   Temperature 97.8BP 110/80  Fahrenheit, heart rate 77,  blood pressure 128/83, saturation 96% on room air.    TIME SPENT:  More than 50 minutes.   ____________________________ Katha Hamming, MD sk:tr D: 05/10/2014 06:46:00 ET T: 05/10/2014 11:43:28 ET JOB#: [id number]  cc: Steele Sizer, MD Chelsea Aus Kirke Corin, MD Katha Hamming, MD, <Dictator>   Katha Hamming MD ELECTRONICALLY SIGNED 05/20/2014 23:53

## 2014-06-06 ENCOUNTER — Encounter: Payer: Self-pay | Admitting: Cardiovascular Disease

## 2014-06-19 ENCOUNTER — Encounter: Payer: Self-pay | Admitting: *Deleted

## 2014-06-19 ENCOUNTER — Telehealth: Payer: Self-pay | Admitting: *Deleted

## 2014-06-19 ENCOUNTER — Ambulatory Visit (INDEPENDENT_AMBULATORY_CARE_PROVIDER_SITE_OTHER): Payer: Medicare Other | Admitting: Cardiovascular Disease

## 2014-06-19 ENCOUNTER — Encounter: Payer: Self-pay | Admitting: Cardiovascular Disease

## 2014-06-19 VITALS — BP 130/80 | HR 58 | Ht 64.0 in | Wt 162.0 lb

## 2014-06-19 DIAGNOSIS — R4182 Altered mental status, unspecified: Secondary | ICD-10-CM | POA: Insufficient documentation

## 2014-06-19 DIAGNOSIS — Z8673 Personal history of transient ischemic attack (TIA), and cerebral infarction without residual deficits: Secondary | ICD-10-CM

## 2014-06-19 DIAGNOSIS — I4891 Unspecified atrial fibrillation: Secondary | ICD-10-CM

## 2014-06-19 DIAGNOSIS — R4701 Aphasia: Secondary | ICD-10-CM | POA: Diagnosis not present

## 2014-06-19 DIAGNOSIS — I480 Paroxysmal atrial fibrillation: Secondary | ICD-10-CM | POA: Insufficient documentation

## 2014-06-19 DIAGNOSIS — Z87440 Personal history of urinary (tract) infections: Secondary | ICD-10-CM

## 2014-06-19 NOTE — Assessment & Plan Note (Signed)
Maintaining normal sinus rhythm. No medication changes made Tolerating anticoagulation

## 2014-06-19 NOTE — Progress Notes (Signed)
Patient ID: Natalie Rollins, female    DOB: December 27, 1929, 81 y.o.   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 He presents to establish care in the [place] office.   Husband presents with her today. Patient is a poor historian. She is relatively nonverbal He reports that she may have had a urinary tract infection. She was given several days of ciprofloxacin prior to admission. UA was normal in the hospital. She was treated for atrial ablation with RVR with diltiazem infusion. Started on anticoagulation given prior stroke history Multaq was stopped previously. Initially started in [place].  In follow-up today, family reports she is doing well. They deny any symptoms concerning for atrial fibrillation. Blood pressure and heart rate have been well-controlled. Husband again reportss he was recently started on ciprofloxacin (he started this on his own with left over medication). He also sometimes gives her doxycycline which he has left over from his prostatitis. Currently she feels she is close to her baseline  EKG shows normal sinus rhythm with rate 60 bpm, diffuse T-wave abnormality through the anterior precordial leads, inferior leads    No Known Allergies  No current outpatient prescriptions on file prior to visit.   No current facility-administered medications on file prior to visit.    Past Medical  History  Diagnosis Date  . Atrial fibrillation     a. unknown if PAF vs persistent at this time given her travel history; b. on ; c. history of post op a-fib 07/2010  . Pituitary mass     a. 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 History of stroke x 2    a. left internal capsule CVA 07/2010; b. right stroke in right basal ganglia 2013  . HLD (hyperlipidemia)   . Hypothyroidism   . Arthritis     a. s/p lumbar fusion c/b broken hardware w/ removal of the fusion & subsequent revision most recently 07/2010  . Recurrent UTI   . Hydrocephalus     has a shunt placed   . Stroke     x 2    Past Surgical History  Procedure Laterality Date  . Spine surgery      metal rods places   . Patella fracture surgery      left   . Tibia fracture surgery      right  . Humerus surgery      right    Social History  reports that she has never smoked. She does not have any smokeless tobacco history on file. She reports that she does not drink alcohol or use illicit drugs.  Family History Family history is unknown by patient.  Review of Systems  Constitutional: Negative.   HENT: Negative.   Eyes: Negative.   Respiratory: Negative.   Cardiovascular: Negative.   Gastrointestinal: Negative.   Endocrine: Negative.   Musculoskeletal: Negative.   Skin: Negative.   Allergic/Immunologic: Negative.   Neurological: Negative.   Hematological:  Negative.   Psychiatric/Behavioral: Negative.   All other systems reviewed and are negative.   BP 130/80 mmHg  Pulse 58  Ht  (1.626 m)  Wt 162 lb (73.483 kg)  BMI 27.79 kg/m2   Physical Exam

## 2014-06-19 NOTE — Assessment & Plan Note (Signed)
Husband has been treating her with either ciprofloxacin or doxycycline leftover from his own treatment. He reports that she had recent UTI though UA was negative on hospital admission. He is again treating her with ciprofloxacin on today's visit. No recent testing performed to confirm UTI Recommended he follow-up with primary care to discuss these issues

## 2014-06-19 NOTE — Telephone Encounter (Signed)
Pt husband calling stating we forgot to add Vitman D 2000 1 once a day to her medication list.  Please add.

## 2014-06-19 NOTE — Assessment & Plan Note (Signed)
Notes indicate prior mental status changes as the reason for her hospital admission were found to be in atrial fibrillation at that time. He had been treating her for UTI prior to that

## 2014-06-19 NOTE — Patient Instructions (Signed)
You are doing well. No medication changes were made.  Please call us if you have new issues that need to be addressed before your next appt.  Your physician wants you to follow-up in: 6 months.  You will receive a reminder letter in the mail two months in advance. If you don't receive a letter, please call our office to schedule the follow-up appointment.   

## 2014-06-19 NOTE — Assessment & Plan Note (Signed)
Prior history of strokes. Tolerating anticoagulation High fall risk

## 2014-06-19 NOTE — Telephone Encounter (Signed)
Vitamin D 2000 units 1 tablet qd added to medication list.

## 2014-08-14 ENCOUNTER — Ambulatory Visit: Admission: EM | Admit: 2014-08-14 | Discharge: 2014-08-14 | Discharge status: Absent without leave | Payer: Self-pay

## 2014-08-14 ENCOUNTER — Ambulatory Visit
Admission: EM | Admit: 2014-08-14 | Discharge: 2014-08-14 | Disposition: A | Discharge status: Home / Self Care | Payer: Medicare Other | Attending: Internal Medicine | Admitting: Internal Medicine

## 2014-08-14 DIAGNOSIS — R350 Frequency of micturition: Secondary | ICD-10-CM | POA: Diagnosis present

## 2014-08-14 DIAGNOSIS — I4891 Unspecified atrial fibrillation: Secondary | ICD-10-CM | POA: Insufficient documentation

## 2014-08-14 DIAGNOSIS — Z79899 Other long term (current) drug therapy: Secondary | ICD-10-CM | POA: Diagnosis not present

## 2014-08-14 DIAGNOSIS — E785 Hyperlipidemia, unspecified: Secondary | ICD-10-CM | POA: Insufficient documentation

## 2014-08-14 DIAGNOSIS — N390 Urinary tract infection, site not specified: Secondary | ICD-10-CM | POA: Diagnosis not present

## 2014-08-14 DIAGNOSIS — E039 Hypothyroidism, unspecified: Secondary | ICD-10-CM | POA: Diagnosis not present

## 2014-08-14 DIAGNOSIS — Z8673 Personal history of transient ischemic attack (TIA), and cerebral infarction without residual deficits: Secondary | ICD-10-CM | POA: Insufficient documentation

## 2014-08-14 LAB — URINALYSIS COMPLETE WITH MICROSCOPIC (ARMC ONLY)
BILIRUBIN URINE: NEGATIVE
Bacteria, UA: NONE SEEN — AB
Glucose, UA: NEGATIVE mg/dL
Hgb urine dipstick: NEGATIVE
Ketones, ur: NEGATIVE mg/dL
NITRITE: NEGATIVE
Protein, ur: NEGATIVE mg/dL
RBC / HPF: NONE SEEN RBC/hpf (ref <3)
SPECIFIC GRAVITY, URINE: 1.02 (ref 1.005–1.030)
pH: 8.5 — ABNORMAL HIGH (ref 5.0–8.0)

## 2014-08-14 MED ORDER — NITROFURANTOIN MONOHYD MACRO 100 MG PO CAPS: 100.0000 mg | ORAL_CAPSULE | Freq: Two times a day (BID) | ORAL | Status: DC

## 2014-08-14 NOTE — ED Notes (Signed)
Pt states "Urinary tract frequency for a week." Denies pain.

## 2014-08-14 NOTE — Discharge Instructions (Signed)
Prescription for macrobid (nitrofurantoin) sent to the [facility] in [place]. Urine culture is pending.  Urinary Tract Infection A urinary tract infection (UTI) can occur any place along the urinary tract. The tract includes the kidneys, ureters, bladder, and urethra. A type of germ called bacteria often causes a UTI. UTIs are often helped with antibiotic medicine.  HOME CARE   If given, take antibiotics as told by your doctor. Finish them even if you start to feel better.  Drink enough fluids to keep your pee (urine) clear or pale yellow.  Avoid tea, drinks with caffeine, and bubbly (carbonated) drinks.  Pee often. Avoid holding your pee in for a long time.  Pee before and after having sex (intercourse).  Wipe from front to back after you poop (bowel movement) if you are a woman. Use each tissue only once. GET HELP RIGHT AWAY IF:   You have back pain.  You have lower belly (abdominal) pain.  You have chills.  You feel sick to your stomach (nauseous).  You throw up (vomit).  Your burning or discomfort with peeing does not go away.  You have a fever.  Your symptoms are not better in 3 days. MAKE SURE YOU:   Understand these instructions.  Will watch your condition.  Will get help right away if you are not doing well or get worse. Document Released: 06/22/2007 Document Revised: 09/28/2011 Document Reviewed: 08/04/2011 [facility] Patient Information 2015 , [place]. This information is not intended to replace advice given to you by your health care provider. Make sure you discuss any questions you have with your health care provider.

## 2014-08-14 NOTE — ED Provider Notes (Signed)
CSN: [id number]     Arrival date & time 08/14/14  4098 History   First MD Initiated Contact with Patient 08/14/14 1106     Chief Complaint  Patient presents with  . Urinary Frequency   HPI  Patient is an 81 year old lady who has had urinary frequency, going as often as every hour, for the last week or so. Husband has noticed cloudy urine in the last several days. No fever, appetite is okay. Not vomiting. No change in bowel habits. No cough.  Past Medical History  Diagnosis Date  . Atrial fibrillation     a. unknown if PAF vs persistent at this time given her travel history; b. on ; c. history of post op a-fib 07/2010  . Pituitary mass     a. 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 History of stroke x 2    a. left internal capsule CVA 07/2010; b. right stroke in right basal ganglia 2013  . HLD (hyperlipidemia)   . Hypothyroidism   . Arthritis     a. s/p lumbar fusion c/b broken hardware w/ removal of the fusion & subsequent revision most recently 07/2010  . Recurrent UTI   . Hydrocephalus     has a shunt placed   . Stroke     x 2   Past Surgical History  Procedure Laterality Date  . Spine surgery      metal rods places   . Patella fracture surgery      left   . Tibia fracture surgery      right  . Humerus surgery      right   Family History  Problem Relation Age of Onset  . Family history unknown: Yes   History  Substance Use Topics  . Smoking status: Never Smoker   . Smokeless tobacco: Not on file  . Alcohol Use: No    Review of Systems  All other systems reviewed and are negative.   Allergies  Review of patient's allergies indicates no known allergies.  Home Medications   Prior to Admission medications   Medication Sig Start Date End Date Taking? Authorizing Provider  Ascorbic Acid (VITAMIN C) 1000 MG tablet Take 1,000 mg by mouth daily.    Yes Historical Provider, MD  atorvastatin (LIPITOR) 40 MG tablet Take  40 mg by mouth daily at 6 PM.    Yes Historical Provider, MD  Calcium-Magnesium-Zinc [phone] MG TABS Take by mouth daily.    Yes Historical Provider, MD  Cholecalciferol (VITAMIN D) 2000 UNITS tablet Take 2,000 Units by mouth daily.   Yes Historical Provider, MD  desmopressin (DDAVP) 0.1 MG tablet Take 0.1 mg by mouth 2 (two) times daily.    Yes Historical Provider, MD  diltiazem (CARDIZEM CD) 120 MG 24 hr capsule Take 120 mg by mouth daily. 05/30/14  Yes Historical Provider, MD  ELIQUIS 2.5 MG TABS tablet Take 2.5 mg by mouth 2 (two) times daily. 06/03/14  Yes Historical Provider, MD  HYDROcodone-acetaminophen (NORCO/VICODIN) 5-325 MG per tablet Take 1 tablet by mouth every 6 (six) hours as needed for moderate pain.   Yes Historical Provider, MD  hydrocortisone (CORTEF) 10 MG tablet Take 40 mg by mouth daily.  05/22/14  Yes Historical Provider, MD  levothyroxine (SYNTHROID, LEVOTHROID) 88 MCG tablet Take 88 mcg by mouth daily before breakfast.   Yes Historical Provider, MD  Misc Natural Products (GLUCOSAMINE CHONDROITIN ADV) TABS Take by mouth 2 (two) times daily.  Yes Historical Provider, MD  Multiple Vitamin (MULTIVITAMIN) tablet Take 1 tablet by mouth daily.   Yes Historical Provider, MD  potassium chloride (K-DUR,KLOR-CON) 10 MEQ tablet Take 10 mEq by mouth daily. 04/21/14  Yes Historical Provider, MD  sodium bicarbonate 650 MG tablet Take 650 mg by mouth 2 (two) times daily. 05/30/14  Yes Historical Provider, MD  vitamin E 400 UNIT capsule Take 400 Units by mouth daily.    Yes Historical Provider, MD          BP 137/67 mmHg  Pulse 64  Temp(Src) 97.4 F (36.3 C) (Tympanic)  Resp 16  Ht 5\' 4"  (1.626 m)  Wt 165 lb (74.844 kg)  BMI 28.31 kg/m2  SpO2 100%  LMP  Physical Exam  Constitutional: She is oriented to person, place, and time. No distress.  Alert, nicely groomed  HENT:  Head: Atraumatic.  Eyes:  Conjugate gaze, no eye redness/drainage  Neck: Neck supple.  Cardiovascular:  Normal rate and regular rhythm.   Pulmonary/Chest: No respiratory distress.  Lungs clear, symmetric breath sounds  Abdominal: She exhibits no distension.  Musculoskeletal: Normal range of motion.  No leg swelling  Neurological: She is alert and oriented to person, place, and time.  Skin: Skin is warm and dry.  No cyanosis  Nursing note and vitals reviewed.   ED Course  Procedures  Results for orders placed or performed during the hospital encounter of 08/14/14  Urinalysis complete, with microscopic  Result Value Ref Range   Color, Urine YELLOW YELLOW   APPearance HAZY (A) CLEAR   Glucose, UA NEGATIVE NEGATIVE mg/dL   Bilirubin Urine NEGATIVE NEGATIVE   Ketones, ur NEGATIVE NEGATIVE mg/dL   Specific Gravity, Urine 1.020 1.005 - 1.030   Hgb urine dipstick NEGATIVE NEGATIVE   pH 8.5 (H) 5.0 - 8.0   Protein, ur NEGATIVE NEGATIVE mg/dL   Nitrite NEGATIVE NEGATIVE   Leukocytes, UA TRACE (A) NEGATIVE   RBC / HPF NONE SEEN <3 RBC/hpf   WBC, UA 6-30 <3 WBC/hpf   Bacteria, UA NONE SEEN (A) RARE   Squamous Epithelial / LPF 0-5 (A) RARE   Amorphous Crystal PRESENT    Urine culture is pending  MDM   1. Urinary tract infection without hematuria, site unspecified    Discharge Medication List as of 08/14/2014 11:12 AM    START taking these medications   Details  nitrofurantoin, macrocrystal-monohydrate, (MACROBID) 100 MG capsule Take 1 capsule (100 mg total) by mouth 2 (two) times daily., Starting 08/14/2014, Until Discontinued, Normal       recheck if not improving in a few days.     Eustace Moore, MD 08/14/14 1116

## 2014-09-01 ENCOUNTER — Ambulatory Visit
Admission: EM | Admit: 2014-09-01 | Discharge: 2014-09-01 | Disposition: A | Discharge status: Home / Self Care | Payer: Medicare Other | Attending: Family Medicine | Admitting: Family Medicine

## 2014-09-01 DIAGNOSIS — Z8673 Personal history of transient ischemic attack (TIA), and cerebral infarction without residual deficits: Secondary | ICD-10-CM | POA: Insufficient documentation

## 2014-09-01 DIAGNOSIS — R350 Frequency of micturition: Secondary | ICD-10-CM | POA: Diagnosis present

## 2014-09-01 DIAGNOSIS — G919 Hydrocephalus, unspecified: Secondary | ICD-10-CM | POA: Insufficient documentation

## 2014-09-01 DIAGNOSIS — N390 Urinary tract infection, site not specified: Secondary | ICD-10-CM | POA: Diagnosis not present

## 2014-09-01 DIAGNOSIS — E785 Hyperlipidemia, unspecified: Secondary | ICD-10-CM | POA: Insufficient documentation

## 2014-09-01 DIAGNOSIS — Z7901 Long term (current) use of anticoagulants: Secondary | ICD-10-CM | POA: Insufficient documentation

## 2014-09-01 DIAGNOSIS — E039 Hypothyroidism, unspecified: Secondary | ICD-10-CM | POA: Diagnosis not present

## 2014-09-01 LAB — URINALYSIS COMPLETE WITH MICROSCOPIC (ARMC ONLY)
BILIRUBIN URINE: NEGATIVE
GLUCOSE, UA: NEGATIVE mg/dL
NITRITE: POSITIVE — AB
Specific Gravity, Urine: 1.02 (ref 1.005–1.030)
pH: 6 (ref 5.0–8.0)

## 2014-09-01 MED ORDER — NITROFURANTOIN MONOHYD MACRO 100 MG PO CAPS: 100.0000 mg | ORAL_CAPSULE | Freq: Two times a day (BID) | ORAL | Status: DC

## 2014-09-01 NOTE — ED Notes (Signed)
Pt discharged by Dr. Judd Gaudier. Discharge instructions were discussed by Dr. Judd Gaudier to pt.

## 2014-09-01 NOTE — ED Notes (Signed)
Seen at [facility] 08/14/14 for c/o cloudy urine. Husband states voiding frequent small amounts, cloudy in color. States "no urine culture was done"

## 2014-09-01 NOTE — ED Provider Notes (Signed)
CSN: [id number]     Arrival date & time 09/01/14  1631 History   First MD Initiated Contact with Patient 09/01/14 1722     Chief Complaint  Patient presents with  . Urinary Frequency   (Consider location/radiation/quality/duration/timing/severity/associated sxs/prior Treatment) HPI Comments: 81 yo female brought in by husband with a concern for "cloudy urine" with strong odor, urinary frequency. Denies any fevers, chills, vomiting or pain. Husband who is patient's caretaker states has had similar UTI episodes in the past.  The history is provided by the spouse.    Past Medical History  Diagnosis Date  . Atrial fibrillation     a. unknown if PAF vs persistent at this time given her travel history; b. on ; c. history of post op a-fib 07/2010  . Pituitary mass     a. 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 History of stroke x 2    a. left internal capsule CVA 07/2010; b. right stroke in right basal ganglia 2013  . HLD (hyperlipidemia)   . Hypothyroidism   . Arthritis     a. s/p lumbar fusion c/b broken hardware w/ removal of the fusion & subsequent revision most recently 07/2010  . Recurrent UTI   . Hydrocephalus     has a shunt placed   . Stroke     x 2   Past Surgical History  Procedure Laterality Date  . Spine surgery      metal rods places   . Patella fracture surgery      left   . Tibia fracture surgery      right  . Humerus surgery      right   Family History  Problem Relation Age of Onset  . Family history unknown: Yes   Social History  Substance Use Topics  . Smoking status: Never Smoker   . Smokeless tobacco: None  . Alcohol Use: No   OB History    No data available     Review of Systems  Allergies  Review of patient's allergies indicates no known allergies.  Home Medications   Prior to Admission medications   Medication Sig Start Date End Date Taking? Authorizing Provider  Ascorbic Acid (VITAMIN C)  1000 MG tablet Take 1,000 mg by mouth daily.    Yes Historical Provider, MD  atorvastatin (LIPITOR) 40 MG tablet Take 40 mg by mouth daily at 6 PM.    Yes Historical Provider, MD  Calcium-Magnesium-Zinc [phone] MG TABS Take by mouth daily.    Yes Historical Provider, MD  Cholecalciferol (VITAMIN D) 2000 UNITS tablet Take 2,000 Units by mouth daily.   Yes Historical Provider, MD  desmopressin (DDAVP) 0.1 MG tablet Take 0.1 mg by mouth 2 (two) times daily.    Yes Historical Provider, MD  diltiazem (CARDIZEM CD) 120 MG 24 hr capsule Take 120 mg by mouth daily. 05/30/14  Yes Historical Provider, MD  ELIQUIS 2.5 MG TABS tablet Take 2.5 mg by mouth 2 (two) times daily. 06/03/14  Yes Historical Provider, MD  hydrocortisone (CORTEF) 10 MG tablet Take 40 mg by mouth daily.  05/22/14  Yes Historical Provider, MD  levothyroxine (SYNTHROID, LEVOTHROID) 88 MCG tablet Take 88 mcg by mouth daily before breakfast.   Yes Historical Provider, MD  Misc Natural Products (GLUCOSAMINE CHONDROITIN ADV) TABS Take by mouth 2 (two) times daily.    Yes Historical Provider, MD  Multiple Vitamin (MULTIVITAMIN) tablet Take 1 tablet by mouth daily.  Yes Historical Provider, MD  potassium chloride (K-DUR,KLOR-CON) 10 MEQ tablet Take 10 mEq by mouth daily. 04/21/14  Yes Historical Provider, MD  HYDROcodone-acetaminophen (NORCO/VICODIN) 5-325 MG per tablet Take 1 tablet by mouth every 6 (six) hours as needed for moderate pain.    Historical Provider, MD  nitrofurantoin, macrocrystal-monohydrate, (MACROBID) 100 MG capsule Take 1 capsule (100 mg total) by mouth 2 (two) times daily. 09/01/14   Payton Mccallum, MD  sodium bicarbonate 650 MG tablet Take 650 mg by mouth 2 (two) times daily. 05/30/14   Historical Provider, MD  vitamin E 400 UNIT capsule Take 400 Units by mouth daily.     Historical Provider, MD   BP 116/57 mmHg  Pulse 72  Temp(Src) 98.2 F (36.8 C) (Tympanic)  Resp 20  Ht 5\' 3"  (1.6 m)  Wt 165 lb (74.844 kg)  BMI 29.24  kg/m2  SpO2 97% Physical Exam  Constitutional: She appears well-developed and well-nourished. No distress.  Abdominal: Soft. Bowel sounds are normal. She exhibits no distension and no mass. There is no tenderness. There is no rebound and no guarding.  Neurological: She is alert.  Skin: No rash noted. She is not diaphoretic.  Vitals reviewed.   ED Course  Procedures (including critical care time) Labs Review Labs Reviewed  URINALYSIS COMPLETEWITH MICROSCOPIC ([facility] ONLY) - Abnormal; Notable for the following:    APPearance CLOUDY (*)    Ketones, ur TRACE (*)    Hgb urine dipstick TRACE (*)    Protein, ur PRESENT (*)    Nitrite POSITIVE (*)    Leukocytes, UA 3+ (*)    Bacteria, UA MANY (*)    Squamous Epithelial / LPF 0-5 (*)    All other components within normal limits  URINE CULTURE    Imaging Review No results found.   MDM   1. UTI (lower urinary tract infection)    Discharge Medication List as of 09/01/2014  6:03 PM    Plan: 1. Test results and diagnosis reviewed with patient 2. rx as per orders; risks, benefits, potential side effects reviewed with patient 3. Recommend supportive treatment with increase water intake 4. Check urine culture 5. F/u prn if symptoms worsen or don't improve  Payton Mccallum, MD 09/01/14 [phone]

## 2014-09-03 LAB — URINE CULTURE

## 2014-09-06 ENCOUNTER — Emergency Department: Payer: Medicare Other

## 2014-09-06 ENCOUNTER — Inpatient Hospital Stay
Admission: EM | Admit: 2014-09-06 | Discharge: 2014-09-07 | DRG: 309 | Disposition: A | Discharge status: Home / Self Care | Payer: Medicare Other | Attending: Internal Medicine | Admitting: Internal Medicine

## 2014-09-06 ENCOUNTER — Encounter: Payer: Self-pay | Admitting: Emergency Medicine

## 2014-09-06 DIAGNOSIS — E876 Hypokalemia: Secondary | ICD-10-CM | POA: Diagnosis present

## 2014-09-06 DIAGNOSIS — M1990 Unspecified osteoarthritis, unspecified site: Secondary | ICD-10-CM | POA: Diagnosis present

## 2014-09-06 DIAGNOSIS — I959 Hypotension, unspecified: Secondary | ICD-10-CM | POA: Diagnosis not present

## 2014-09-06 DIAGNOSIS — Z982 Presence of cerebrospinal fluid drainage device: Secondary | ICD-10-CM

## 2014-09-06 DIAGNOSIS — N390 Urinary tract infection, site not specified: Secondary | ICD-10-CM | POA: Diagnosis present

## 2014-09-06 DIAGNOSIS — E039 Hypothyroidism, unspecified: Secondary | ICD-10-CM | POA: Diagnosis present

## 2014-09-06 DIAGNOSIS — Z7901 Long term (current) use of anticoagulants: Secondary | ICD-10-CM | POA: Diagnosis not present

## 2014-09-06 DIAGNOSIS — Z888 Allergy status to other drugs, medicaments and biological substances status: Secondary | ICD-10-CM | POA: Diagnosis not present

## 2014-09-06 DIAGNOSIS — Z8673 Personal history of transient ischemic attack (TIA), and cerebral infarction without residual deficits: Secondary | ICD-10-CM | POA: Diagnosis not present

## 2014-09-06 DIAGNOSIS — Z981 Arthrodesis status: Secondary | ICD-10-CM

## 2014-09-06 DIAGNOSIS — W19XXXA Unspecified fall, initial encounter: Secondary | ICD-10-CM | POA: Diagnosis not present

## 2014-09-06 DIAGNOSIS — E785 Hyperlipidemia, unspecified: Secondary | ICD-10-CM | POA: Diagnosis not present

## 2014-09-06 DIAGNOSIS — Z9889 Other specified postprocedural states: Secondary | ICD-10-CM

## 2014-09-06 DIAGNOSIS — R55 Syncope and collapse: Secondary | ICD-10-CM | POA: Diagnosis present

## 2014-09-06 DIAGNOSIS — I4891 Unspecified atrial fibrillation: Secondary | ICD-10-CM | POA: Diagnosis present

## 2014-09-06 DIAGNOSIS — E230 Hypopituitarism: Secondary | ICD-10-CM | POA: Diagnosis present

## 2014-09-06 DIAGNOSIS — Z87440 Personal history of urinary (tract) infections: Secondary | ICD-10-CM

## 2014-09-06 DIAGNOSIS — E860 Dehydration: Secondary | ICD-10-CM | POA: Diagnosis present

## 2014-09-06 DIAGNOSIS — I480 Paroxysmal atrial fibrillation: Principal | ICD-10-CM | POA: Diagnosis present

## 2014-09-06 HISTORY — DX: Paroxysmal atrial fibrillation: I48.0

## 2014-09-06 LAB — URINALYSIS COMPLETE WITH MICROSCOPIC (ARMC ONLY)
Bacteria, UA: NONE SEEN
Bilirubin Urine: NEGATIVE
Glucose, UA: NEGATIVE mg/dL
Hgb urine dipstick: NEGATIVE
Ketones, ur: NEGATIVE mg/dL
Leukocytes, UA: NEGATIVE
Nitrite: NEGATIVE
Protein, ur: NEGATIVE mg/dL
Specific Gravity, Urine: 1.011 (ref 1.005–1.030)
pH: 6 (ref 5.0–8.0)

## 2014-09-06 LAB — CBC
HCT: 45.4 % (ref 35.0–47.0)
Hemoglobin: 14.8 g/dL (ref 12.0–16.0)
MCH: 28.5 pg (ref 26.0–34.0)
MCHC: 32.5 g/dL (ref 32.0–36.0)
MCV: 87.9 fL (ref 80.0–100.0)
Platelets: 203 10*3/uL (ref 150–440)
RBC: 5.17 MIL/uL (ref 3.80–5.20)
RDW: 17.2 % — AB (ref 11.5–14.5)
WBC: 13.3 10*3/uL — AB (ref 3.6–11.0)

## 2014-09-06 LAB — BASIC METABOLIC PANEL
ANION GAP: 15 (ref 5–15)
BUN: 17 mg/dL (ref 6–20)
CALCIUM: 8.8 mg/dL — AB (ref 8.9–10.3)
CO2: 18 mmol/L — ABNORMAL LOW (ref 22–32)
Chloride: 106 mmol/L (ref 101–111)
Creatinine, Ser: 1.19 mg/dL — ABNORMAL HIGH (ref 0.44–1.00)
GFR calc Af Amer: 48 mL/min — ABNORMAL LOW (ref >60)
GFR, EST NON AFRICAN AMERICAN: 41 mL/min — AB (ref >60)
GLUCOSE: 137 mg/dL — AB (ref 65–99)
Potassium: 3.4 mmol/L — ABNORMAL LOW (ref 3.5–5.1)
SODIUM: 139 mmol/L (ref 135–145)

## 2014-09-06 LAB — ETHANOL: ALCOHOL ETHYL (B): 10 mg/dL — AB (ref <5)

## 2014-09-06 LAB — APTT: APTT: 29 s (ref 24–36)

## 2014-09-06 LAB — PROTIME-INR
INR: 0.97
Prothrombin Time: 13.1 seconds (ref 11.4–15.0)

## 2014-09-06 LAB — TROPONIN I: TROPONIN I: 0.03 ng/mL (ref <0.031)

## 2014-09-06 MED ORDER — TETANUS-DIPHTH-ACELL PERTUSSIS 5-2.5-18.5 LF-MCG/0.5 IM SUSP
0.5000 mL | Freq: Once | INTRAMUSCULAR | Status: AC
  Administered 2014-09-06: 0.5 mL via INTRAMUSCULAR
  Filled 2014-09-06: qty 0.5

## 2014-09-06 MED ORDER — DILTIAZEM HCL 25 MG/5ML IV SOLN
INTRAVENOUS | Status: AC
  Filled 2014-09-06: qty 5

## 2014-09-06 MED ORDER — DILTIAZEM HCL 25 MG/5ML IV SOLN: 5.0000 mg | Freq: Once | INTRAVENOUS | Status: DC

## 2014-09-06 MED ORDER — SODIUM CHLORIDE 0.9 % IV BOLUS (SEPSIS)
1000.0000 mL | Freq: Once | INTRAVENOUS | Status: AC
  Administered 2014-09-06: 1000 mL via INTRAVENOUS

## 2014-09-06 MED ORDER — DEXTROSE 5 % IV SOLN
60.0000 mg/h | Freq: Once | INTRAVENOUS | Status: AC
  Administered 2014-09-06: 60 mg/h via INTRAVENOUS
  Filled 2014-09-06: qty 9

## 2014-09-06 NOTE — H&P (Addendum)
[facility] - [place] [facility]   PATIENT NAME: Natalie Rollins    MR#:  [id number]  DATE OF BIRTH:  30-May-1929  DATE OF ADMISSION:  09/06/2014  PRIMARY CARE PHYSICIAN: Vonita Moss, MD   REQUESTING/REFERRING PHYSICIAN: Fanny Bien, MD  CHIEF COMPLAINT:   Chief Complaint  Patient presents with  . Altered Mental Status    arrives by private car with husband, weak, diaphoretic    HISTORY OF PRESENT ILLNESS:  Natalie Rollins  is a 81 y.o. female who presents with altered mental status and near-syncope. Brought to the ED by her husband who states that she became weak and lethargic at home. He brought her to the ED where she was found to be in A. fib with 6RVR, with a heart rate in the 140s. She was also minimally responsive at that time.  With administration of IV fluids patient became more responsive.she has baseline deficits after previous strokes. Husband at the bedside stated that she return to her baseline mental status and responsiveness. On this writer's interview with the patient, she was responding to questions, though not providing much in the way of history. She was following commands, and is oriented to person and place but not time and circumstance. Initially there was some concern for recurrent stroke, however patient has returned to her baseline functioning without any new deficits. Amiodarone drip in the ED was started with good rate control. Hospitalists were called for admission for A. fib with RVR and subsequent near syncope.   PAST MEDICAL HISTORY:   Past Medical History  Diagnosis Date  . Atrial fibrillation     a. unknown if PAF vs persistent at this time given her travel history; b. on ; c. history of post op a-fib 07/2010  . Pituitary mass     a. 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 History of stroke x 2    a. left internal capsule CVA 07/2010; b. right stroke in right basal ganglia 2013  .  HLD (hyperlipidemia)   . Hypothyroidism   . Arthritis     a. s/p lumbar fusion c/b broken hardware w/ removal of the fusion & subsequent revision most recently 07/2010  . Recurrent UTI   . Hydrocephalus     has a shunt placed   . Stroke     x 2    PAST SURGICAL HISTORY:   Past Surgical History  Procedure Laterality Date  . Spine surgery      metal rods places   . Patella fracture surgery      left   . Tibia fracture surgery      right  . Humerus surgery      right    SOCIAL HISTORY:   Social History  Substance Use Topics  . Smoking status: Never Smoker   . Smokeless tobacco: Not on file  . Alcohol Use: Yes    FAMILY HISTORY:   Family History  Problem Relation Age of Onset  . Family history unknown: Yes    DRUG ALLERGIES:  No Known Allergies  MEDICATIONS AT HOME:   Prior to Admission medications   Medication Sig Start Date End Date Taking? Authorizing Provider  Ascorbic Acid (VITAMIN C) 1000 MG tablet Take 1,000 mg by mouth daily.    Yes Historical Provider, MD  atorvastatin (LIPITOR) 10 MG tablet Take 1 tablet by mouth daily. 08/13/14  Yes Historical Provider, MD  Calcium-Magnesium-Zinc [phone] MG TABS Take by mouth  daily.    Yes Historical Provider, MD  Cholecalciferol (VITAMIN D) 2000 UNITS tablet Take 2,000 Units by mouth daily.   Yes Historical Provider, MD  desmopressin (DDAVP) 0.1 MG tablet Take 0.1 mg by mouth 2 (two) times daily.    Yes Historical Provider, MD  diltiazem (CARDIZEM CD) 120 MG 24 hr capsule Take 120 mg by mouth daily. 05/30/14  Yes Historical Provider, MD  ELIQUIS 2.5 MG TABS tablet Take 2.5 mg by mouth 2 (two) times daily. 06/03/14  Yes Historical Provider, MD  hydrocortisone (CORTEF) 10 MG tablet Take 40 mg by mouth daily.  05/22/14  Yes Historical Provider, MD  levothyroxine (SYNTHROID, LEVOTHROID) 88 MCG tablet Take 88 mcg by mouth daily before breakfast.   Yes Historical Provider, MD  Misc Natural Products (GLUCOSAMINE CHONDROITIN ADV)  TABS Take by mouth 2 (two) times daily.    Yes Historical Provider, MD  Multiple Vitamin (MULTIVITAMIN) tablet Take 1 tablet by mouth daily.   Yes Historical Provider, MD  nitrofurantoin, macrocrystal-monohydrate, (MACROBID) 100 MG capsule Take 1 capsule (100 mg total) by mouth 2 (two) times daily. 09/01/14  Yes Payton Mccallum, MD  potassium chloride (K-DUR,KLOR-CON) 10 MEQ tablet Take 10 mEq by mouth daily. 04/21/14  Yes Historical Provider, MD  sodium bicarbonate 650 MG tablet Take 650 mg by mouth 2 (two) times daily. 05/30/14  Yes Historical Provider, MD  vitamin E 400 UNIT capsule Take 400 Units by mouth daily.    Yes Historical Provider, MD    REVIEW OF SYSTEMS:  Review of Systems  Constitutional: Negative for fever, chills, weight loss and malaise/fatigue.  HENT: Negative for ear pain, hearing loss and tinnitus.   Eyes: Negative for blurred vision, double vision, pain and redness.  Respiratory: Negative for cough, hemoptysis and shortness of breath.   Cardiovascular: Negative for chest pain, palpitations, orthopnea and leg swelling.  Gastrointestinal: Negative for nausea, vomiting, abdominal pain, diarrhea and constipation.  Genitourinary: Negative for dysuria, frequency and hematuria.  Musculoskeletal: Negative for back pain, joint pain and neck pain.  Skin:       No acne, rash, or lesions  Neurological: Negative for dizziness, tremors, focal weakness and weakness.  Endo/Heme/Allergies: Negative for polydipsia. Does not bruise/bleed easily.  Psychiatric/Behavioral: Negative for depression. The patient is not nervous/anxious and does not have insomnia.      VITAL SIGNS:   Filed Vitals:   09/06/14 2200 09/06/14 2230 09/06/14 2300 09/06/14 2313  BP: 113/84 116/84 128/81 128/83  Pulse: 39 103  53  Temp:      TempSrc:      Resp: 22 20 19 17   Height:      Weight:      SpO2: 97% 99%  98%   Wt Readings from Last 3 Encounters:  09/06/14 76.204 kg (168 lb)  09/01/14 74.844 kg (165 lb)   08/14/14 74.844 kg (165 lb)    PHYSICAL EXAMINATION:  Physical Exam  Vitals reviewed. Constitutional: She appears well-developed and well-nourished. No distress.  HENT:  Head: Normocephalic and atraumatic.  Mouth/Throat: Oropharynx is clear and moist.  Eyes: Conjunctivae and EOM are normal. Pupils are equal, round, and reactive to light. No scleral icterus.  Neck: Normal range of motion. Neck supple. No JVD present. No thyromegaly present.  Cardiovascular: Intact distal pulses.  Exam reveals no gallop and no friction rub.   No murmur heard. Borderline tachycardic  Respiratory: Effort normal and breath sounds normal. No respiratory distress. She has no wheezes. She has no rales.  GI: Soft.  Bowel sounds are normal. She exhibits no distension. There is no tenderness.  Musculoskeletal: Normal range of motion. She exhibits no edema.  No arthritis, no gout  Lymphadenopathy:    She has no cervical adenopathy.  Neurological: She is alert. No cranial nerve deficit.  Oriented 2. No dysarthria, no aphasia  Skin: Skin is warm and dry. No rash noted. No erythema.  Psychiatric:  Unable to assess due to patient's baseline condition    LABORATORY PANEL:   CBC  Recent Labs Lab 09/06/14 1906  WBC 13.3*  HGB 14.8  HCT 45.4  PLT 203   ------------------------------------------------------------------------------------------------------------------  Chemistries   Recent Labs Lab 09/06/14 1906  NA 139  K 3.4*  CL 106  CO2 18*  GLUCOSE 137*  BUN 17  CREATININE 1.19*  CALCIUM 8.8*   ------------------------------------------------------------------------------------------------------------------  Cardiac Enzymes  Recent Labs Lab 09/06/14 1906  TROPONINI 0.03   ------------------------------------------------------------------------------------------------------------------  RADIOLOGY:  Ct Head Wo Contrast  09/06/2014   CLINICAL DATA:  Patient became weak acutely 1 hour  ago. Diaphoresis. Code stroke.  EXAM: CT HEAD WITHOUT CONTRAST  TECHNIQUE: Contiguous axial images were obtained from the base of the skull through the vertex without intravenous contrast.  COMPARISON:  History of sellar and suprasellar meningioma.  FINDINGS: The brain shows generalized atrophy. There are extensive chronic small vessel ischemic changes throughout both cerebral hemispheres. Heavily calcified sellar and suprasellar meningioma is unchanged from the previous studies. VP shunt placed from a right posterior approach is unchanged with its tip in the lateral ventricular system. No change in ventricular size. 6 mm calcified meningioma in the left parietal convexity is unchanged.  IMPRESSION: No change. No acute finding. Atrophy and chronic small vessel disease. Skullbase meningioma. VP shunt. No evidence of shunt malfunction.  These results were called by telephone at the time of interpretation on 09/06/2014 at 7:45 pm to Dr. Sharyn Creamer , who verbally acknowledged these results.   Electronically Signed   By: Paulina Fusi M.D.   On: 09/06/2014 19:49   Dg Chest Port 1 View  09/06/2014   CLINICAL DATA:  Altered mental status  EXAM: PORTABLE CHEST - 1 VIEW  COMPARISON:  05/06/2014  FINDINGS: No cardiomegaly and stable aortic tortuosity. There is a ventriculoperitoneal shunt catheter which traverses the right chest, contiguous where visualized.  There is no edema, consolidation, effusion, or pneumothorax.  Remote but likely partially healed right humeral neck fracture. Extensive thoracolumbar spinal fixation.  IMPRESSION: No active disease.   Electronically Signed   By: Marnee Spring M.D.   On: 09/06/2014 21:04    EKG:   Orders placed or performed during the hospital encounter of 09/06/14  . ED EKG  . ED EKG    IMPRESSION AND PLAN:  Principal Problem:   Atrial fibrillation with RVR - requiring amiodarone drip, she was mildly hypotensive when she first came in, but this is improved with fluids. We  will continue amiodarone drip for now for rate control, and get a cardiology consult for further recommendations. We'll also continue the patient's home oral rate controlling medications Active Problems:   Near syncope - Most likely due to some dehydration and continuous blood pressure from her panhypopituitarism, in conjunction with her episode of A. fib with RVR.less likely stroke given the fact the patient has returned so quickly to her baseline functioning. Defer to cardiology recommendations    UTI (lower urinary tract infection) - Patient was diagnosed with this 5 days ago, we will continue her nitrofurantoin as her urine  culture was susceptible to this    Panhypopituitarism - Continue home blood pressure support medications    History of stroke - Patient is on Eliquis for chronic anticoagulation, will continue this while here    HLD (hyperlipidemia) - continue home statin  All the records are reviewed and case discussed with ED provider. Management plans discussed with the patient and/or family.  DVT PROPHYLAXIS: Systemic anticoagulation  ADMISSION STATUS: Inpatient  CODE STATUS: full, though this will need to be clarified with her husband tomorrow as patient is not able to clearly answer this question Advance Directive Documentation        Most Recent Value   Type of Advance Directive  Healthcare Power of Attorney, Living will   Pre-existing out of facility DNR order (yellow form or pink MOST form)     "MOST" Form in Place?        TOTAL TIME TAKING CARE OF THIS PATIENT:40 minutes.    Sylvana Bonk FIELDING 09/06/2014, 11:27 PM  [facility]  Office  [phone]  CC: Primary care physician; Vonita Moss, MD

## 2014-09-06 NOTE — ED Provider Notes (Signed)
[facility] Emergency Department Provider Note  ____________________________________________  Time seen: Approximately 7:30 PM  I have reviewed the triage vital signs and the nursing notes.  EM caveat: Patient has some difficulties with speaking since having 2 previous strokes. Patient also very somnolent, not able to follow commands closely.  HISTORY  Chief Complaint Altered Mental Status  Please note that on arrival, the patient was unresponsive. Her eyes were open, but she is not following any commands or answering questions appropriately.  HPI Natalie Rollins is a 81 y.o. female who was with her husband at dinner, when she became very sweaty and weak. States she went to stand in tilted to the side and became very unsteady on her feet, he had to assist her into the car where she fell along the side of the door scraping her arm but not hitting her head or sustaining any other injury.  He reports that she drove her to the emergency room, and brought her here for further evaluation. He does report that her normal baseline is that she has a lot of trouble communicating, she grunts at times, and normally is able to get up and walk.  Patient is on Eliquis  Past Medical History  Diagnosis Date  . Atrial fibrillation     a. unknown if PAF vs persistent at this time given her travel history; b. on ; c. history of post op a-fib 07/2010  . Pituitary mass     a. 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 History of stroke x 2    a. left internal capsule CVA 07/2010; b. right stroke in right basal ganglia 2013  . HLD (hyperlipidemia)   . Hypothyroidism   . Arthritis     a. s/p lumbar fusion c/b broken hardware w/ removal of the fusion & subsequent revision most recently 07/2010  . Recurrent UTI   . Hydrocephalus     has a shunt placed   . Stroke     x 2    Patient Active Problem List   Diagnosis Date Noted  .  Atrial fibrillation, unspecified 06/19/2014  . History of stroke 06/19/2014  . Nonverbal 06/19/2014  . Altered mental status 06/19/2014  . History of frequent urinary tract infections 06/19/2014    Past Surgical History  Procedure Laterality Date  . Spine surgery      metal rods places   . Patella fracture surgery      left   . Tibia fracture surgery      right  . Humerus surgery      right    Current Outpatient Rx  Name  Route  Sig  Dispense  Refill  . Ascorbic Acid (VITAMIN C) 1000 MG tablet   Oral   Take 1,000 mg by mouth daily.          Marland Kitchen atorvastatin (LIPITOR) 40 MG tablet   Oral   Take 40 mg by mouth daily at 6 PM.          . Calcium-Magnesium-Zinc 333-133-5 MG TABS   Oral   Take by mouth daily.          . Cholecalciferol (VITAMIN D) 2000 UNITS tablet   Oral   Take 2,000 Units by mouth daily.         Marland Kitchen desmopressin (DDAVP) 0.1 MG tablet   Oral   Take 0.1 mg by mouth 2 (two) times daily.          Marland Kitchen  diltiazem (CARDIZEM CD) 120 MG 24 hr capsule   Oral   Take 120 mg by mouth daily.      1   . ELIQUIS 2.5 MG TABS tablet   Oral   Take 2.5 mg by mouth 2 (two) times daily.      1     Dispense as written.   Marland Kitchen HYDROcodone-acetaminophen (NORCO/VICODIN) 5-325 MG per tablet   Oral   Take 1 tablet by mouth every 6 (six) hours as needed for moderate pain.         . hydrocortisone (CORTEF) 10 MG tablet   Oral   Take 40 mg by mouth daily.       1   . levothyroxine (SYNTHROID, LEVOTHROID) 88 MCG tablet   Oral   Take 88 mcg by mouth daily before breakfast.         . Misc Natural Products (GLUCOSAMINE CHONDROITIN ADV) TABS   Oral   Take by mouth 2 (two) times daily.          . Multiple Vitamin (MULTIVITAMIN) tablet   Oral   Take 1 tablet by mouth daily.         . nitrofurantoin, macrocrystal-monohydrate, (MACROBID) 100 MG capsule   Oral   Take 1 capsule (100 mg total) by mouth 2 (two) times daily.   14 capsule   0   . potassium  chloride (K-DUR,KLOR-CON) 10 MEQ tablet   Oral   Take 10 mEq by mouth daily.      0   . sodium bicarbonate 650 MG tablet   Oral   Take 650 mg by mouth 2 (two) times daily.      1   . vitamin E 400 UNIT capsule   Oral   Take 400 Units by mouth daily.            Allergies Review of patient's allergies indicates no known allergies.  Family History  Problem Relation Age of Onset  . Family history unknown: Yes    Social History Social History  Substance Use Topics  . Smoking status: Never Smoker   . Smokeless tobacco: None  . Alcohol Use: Yes    Review of Systems - per the husband. Patient unable to give a full and clear history. Constitutional: No fever/chills Cardiovascular: Denies chest pain. Respiratory: Denies shortness of breath. Gastrointestinal: No abdominal pain.  No nausea, no vomiting.  No diarrhea.  No constipation. Genitourinary: Negative for dysuria. Skin: Negative for rash. Neurological: Negative for headaches, focal weakness or numbness.   ____________________________________________   PHYSICAL EXAM:  VITAL SIGNS: ED Triage Vitals  Enc Vitals Group     BP 09/06/14 1916 104/70 mmHg     Pulse Rate 09/06/14 1916 166     Resp 09/06/14 1916 20     Temp 09/06/14 1916 97.6 F (36.4 C)     Temp Source 09/06/14 1916 Oral     SpO2 09/06/14 1916 96 %     Weight 09/06/14 1916 168 lb (76.204 kg)     Height 09/06/14 1916 5\' 3"  (1.6 m)     Head Cir --      Peak Flow --      Pain Score 09/06/14 1917 0     Pain Loc --      Pain Edu? --      Excl. in GC? --     Constitutional: Patient eyes open, occasional grunting or sonorous respirations, she does localize pain with IV insertions. Eyes: Conjunctivae are normal.  PERRL. EOMI. Head: Atraumatic. Nose: No congestion/rhinnorhea. Mouth/Throat: Mucous membranes are dry.  Oropharynx non-erythematous. Neck: No stridor.   Cardiovascular: Tachycardic and irregular. Grossly normal heart sounds.  Good  peripheral circulation. Respiratory: Normal respiratory effort.  No retractions. Lungs CTAB. Gastrointestinal: Soft and nontender. No distention. No abdominal bruits. No CVA tenderness. Musculoskeletal: No lower extremity tenderness nor edema.  No joint effusions. Neurologic:  Patient will only say words like no, or it hurts. She does move all extremities, there is no obvious facial droop, there is no obvious localized logic deficit, she is somnolent and makes performing close neurologic exam challenging. Skin:  Skin is warm, dry and intact. 2 moderate sized skin tear is noted on the left upper extremity without deformity Psychiatric: Unable to evaluate.  Patient's husband reports she was in her normal state of health until just prior to arrival in the second occurred.  ____________________________________________   LABS (all labs ordered are listed, but only abnormal results are displayed)  Labs Reviewed  BASIC METABOLIC PANEL - Abnormal; Notable for the following:    Potassium 3.4 (*)    CO2 18 (*)    Glucose, Bld 137 (*)    Creatinine, Ser 1.19 (*)    Calcium 8.8 (*)    GFR calc non Af Amer 41 (*)    GFR calc Af Amer 48 (*)    All other components within normal limits  CBC - Abnormal; Notable for the following:    WBC 13.3 (*)    RDW 17.2 (*)    All other components within normal limits  ETHANOL - Abnormal; Notable for the following:    Alcohol, Ethyl (B) 10 (*)    All other components within normal limits  URINALYSIS COMPLETEWITH MICROSCOPIC ([facility] ONLY) - Abnormal; Notable for the following:    Color, Urine AMBER (*)    APPearance HAZY (*)    Squamous Epithelial / LPF 0-5 (*)    All other components within normal limits  APTT  TROPONIN I  PROTIME-INR   ____________________________________________  EKG  Reviewed anterior by me Atrial fibrillation with rapid ventricular response Ancillary 140 QRS 90 QTc 400 Interpreted as atrial fibrillation with rapid ventricular  response with minimal T-wave abnormality seen in the anterior distribution was slight depression in V2 and V3 ____________________________________________  RADIOLOGY  PORTABLE CHEST - 1 VIEW  COMPARISON: 05/06/2014  FINDINGS: No cardiomegaly and stable aortic tortuosity. There is a ventriculoperitoneal shunt catheter which traverses the right chest, contiguous where visualized.  There is no edema, consolidation, effusion, or pneumothorax.  Remote but likely partially healed right humeral neck fracture. Extensive thoracolumbar spinal fixation.  IMPRESSION: No active disease.   Electronically Signed By: Marnee Spring M.D. On: 09/06/2014 21:04          CT Head Wo Contrast (Final result) Result time: 09/06/14 19:49:42   Final result by Rad Results In Interface (09/06/14 19:49:42)   Narrative:   CLINICAL DATA: Patient became weak acutely 1 hour ago. Diaphoresis. Code stroke.  EXAM: CT HEAD WITHOUT CONTRAST  TECHNIQUE: Contiguous axial images were obtained from the base of the skull through the vertex without intravenous contrast.  COMPARISON: History of sellar and suprasellar meningioma.  FINDINGS: The brain shows generalized atrophy. There are extensive chronic small vessel ischemic changes throughout both cerebral hemispheres. Heavily calcified sellar and suprasellar meningioma is unchanged from the previous studies. VP shunt placed from a right posterior approach is unchanged with its tip in the lateral ventricular system. No change in ventricular size. 6  mm calcified meningioma in the left parietal convexity is unchanged.  IMPRESSION: No change. No acute finding. Atrophy and chronic small vessel disease. Skullbase meningioma. VP shunt. No evidence of shunt malfunction.  These results were called by telephone at the time of interpretation on 09/06/2014 at 7:45 pm to Dr. Sharyn Creamer , who verbally acknowledged these results.      ____________________________________________   PROCEDURES  Procedure(s) performed: None  Critical Care performed: Yes, see critical care note(s)   CRITICAL CARE Performed by: Sharyn Creamer   Total critical care time: 35  Critical care time was exclusive of separately billable procedures and treating other patients.  Critical care was necessary to treat or prevent imminent or life-threatening deterioration.  Critical care was time spent personally by me on the following activities: development of treatment plan with patient and/or surrogate as well as nursing, discussions with consultants, evaluation of patient's response to treatment, examination of patient, obtaining history from patient or surrogate, ordering and performing treatments and interventions, ordering and review of laboratory studies, ordering and review of radiographic studies, pulse oximetry and re-evaluation of patient's condition.  Patient required immediate evaluation for acute alteration of mental status along with tachyarrhythmia and intravenous antiarrhythmics medication. ____________________________________________   INITIAL IMPRESSION / ASSESSMENT AND PLAN / ED COURSE  Pertinent labs & imaging results that were available during my care of the patient were reviewed by me and considered in my medical decision making (see chart for details).  Patient presents with what appears to be a near syncopal episode, altered mental status after an episode of diaphoresis. Differential diagnoses are broad, includes acute cardiac, sepsis, metabolic, encephalopathy, stroke, transient ischemic event, arrhythmia, and other etiologies. Because the patient is fairly somnolent and altered, and is hard to get a clear exam but I find no acute focal abnormalities to suggest stroke. Her head CT does not show any acute disease or bleed. The patient is on Eliquis which her husband states she is actively taking, she does have been an  acute stroke she would not be a good care for TPA because of being on this.    ----------------------------------------- 9:03 PM on 09/06/2014 -----------------------------------------  Husband reports the patient is acting normally now. She is very hard of hearing, but she is communicating with him as she normally does. On exam, she is able to raise her hands and raise both legs. She has no focal deficits. She smiles and follows commands now with some slight encouragement via verbal cues. Husband is very clear that this is her normal level of communication and she appears to be in no distress.  ----------------------------------------- 9:10 PM on 09/06/2014 -----------------------------------------  Discussed with Dr. Excell Seltzer, I have initiated amiodarone per his recommendation proximally 45 minutes ago. Heart rate is improving, blood pressure improving with hydration, we'll admit the patient to hospital for ongoing management and possible need to rule out TIA or ischemic event though the patient's husband reports that she is now back to her baseline and I find no acute focal findings. We will continue her on antiarrhythmics for atrial fibrillation with RVR, which alternatively may be another cause that she had this near syncopal episode.  BuSpar, obvious etiology is not clear to me as to why this event occurred but it is reassuring that her symptoms are improving.  ____________________________________________   FINAL CLINICAL IMPRESSION(S) / ED DIAGNOSES  Final diagnoses:  None      Sharyn Creamer, MD 09/06/14 2112

## 2014-09-06 NOTE — ED Notes (Signed)
Per husband approx 1 hour ago patient weak, was bringing her to ED and sliding into car got skin tears, arrived very weak, diaphoretic, unable to sit up in chair, to room 18 with 2 persons holding patient, less diaphoretic when reclined on [place]erney, moans which per husband is normal for her, MD and nurse x 4 to room.

## 2014-09-06 NOTE — ED Notes (Signed)
Pt's skin tears washed with sterile saline and gauze.  Attempted to approximate edges and applied steri-strips.  Covered skin tears with tegaderms

## 2014-09-07 ENCOUNTER — Encounter: Payer: Self-pay | Admitting: Physician Assistant

## 2014-09-07 DIAGNOSIS — I959 Hypotension, unspecified: Secondary | ICD-10-CM | POA: Diagnosis present

## 2014-09-07 DIAGNOSIS — I480 Paroxysmal atrial fibrillation: Secondary | ICD-10-CM | POA: Diagnosis present

## 2014-09-07 DIAGNOSIS — N390 Urinary tract infection, site not specified: Secondary | ICD-10-CM | POA: Diagnosis present

## 2014-09-07 DIAGNOSIS — E860 Dehydration: Secondary | ICD-10-CM | POA: Diagnosis present

## 2014-09-07 DIAGNOSIS — Z888 Allergy status to other drugs, medicaments and biological substances status: Secondary | ICD-10-CM | POA: Diagnosis not present

## 2014-09-07 DIAGNOSIS — Z981 Arthrodesis status: Secondary | ICD-10-CM | POA: Diagnosis not present

## 2014-09-07 DIAGNOSIS — Z982 Presence of cerebrospinal fluid drainage device: Secondary | ICD-10-CM | POA: Diagnosis not present

## 2014-09-07 DIAGNOSIS — Z8673 Personal history of transient ischemic attack (TIA), and cerebral infarction without residual deficits: Secondary | ICD-10-CM | POA: Diagnosis not present

## 2014-09-07 DIAGNOSIS — E785 Hyperlipidemia, unspecified: Secondary | ICD-10-CM | POA: Diagnosis present

## 2014-09-07 DIAGNOSIS — M1990 Unspecified osteoarthritis, unspecified site: Secondary | ICD-10-CM | POA: Diagnosis present

## 2014-09-07 DIAGNOSIS — I4891 Unspecified atrial fibrillation: Secondary | ICD-10-CM | POA: Diagnosis not present

## 2014-09-07 DIAGNOSIS — E039 Hypothyroidism, unspecified: Secondary | ICD-10-CM | POA: Diagnosis present

## 2014-09-07 DIAGNOSIS — Z87440 Personal history of urinary (tract) infections: Secondary | ICD-10-CM | POA: Diagnosis not present

## 2014-09-07 DIAGNOSIS — E876 Hypokalemia: Secondary | ICD-10-CM | POA: Diagnosis present

## 2014-09-07 DIAGNOSIS — R55 Syncope and collapse: Secondary | ICD-10-CM | POA: Diagnosis present

## 2014-09-07 DIAGNOSIS — W19XXXA Unspecified fall, initial encounter: Secondary | ICD-10-CM | POA: Diagnosis present

## 2014-09-07 DIAGNOSIS — Z9889 Other specified postprocedural states: Secondary | ICD-10-CM | POA: Diagnosis not present

## 2014-09-07 DIAGNOSIS — E230 Hypopituitarism: Secondary | ICD-10-CM | POA: Diagnosis present

## 2014-09-07 DIAGNOSIS — Z7901 Long term (current) use of anticoagulants: Secondary | ICD-10-CM | POA: Diagnosis not present

## 2014-09-07 LAB — POTASSIUM
Potassium: 2.9 mmol/L — CL (ref 3.5–5.1)
Potassium: 3.2 mmol/L — ABNORMAL LOW (ref 3.5–5.1)

## 2014-09-07 LAB — BASIC METABOLIC PANEL
ANION GAP: 11 (ref 5–15)
BUN: 14 mg/dL (ref 6–20)
CALCIUM: 8.6 mg/dL — AB (ref 8.9–10.3)
CO2: 19 mmol/L — AB (ref 22–32)
CREATININE: 0.83 mg/dL (ref 0.44–1.00)
Chloride: 114 mmol/L — ABNORMAL HIGH (ref 101–111)
GFR calc Af Amer: >60 mL/min (ref >60)
GFR calc non Af Amer: >60 mL/min (ref >60)
GLUCOSE: 93 mg/dL (ref 65–99)
Potassium: 3.8 mmol/L (ref 3.5–5.1)
Sodium: 144 mmol/L (ref 135–145)

## 2014-09-07 LAB — CBC
HCT: 41.6 % (ref 35.0–47.0)
HEMOGLOBIN: 13.6 g/dL (ref 12.0–16.0)
MCH: 28.4 pg (ref 26.0–34.0)
MCHC: 32.8 g/dL (ref 32.0–36.0)
MCV: 86.5 fL (ref 80.0–100.0)
Platelets: 152 10*3/uL (ref 150–440)
RBC: 4.8 MIL/uL (ref 3.80–5.20)
RDW: 17.1 % — AB (ref 11.5–14.5)
WBC: 9 10*3/uL (ref 3.6–11.0)

## 2014-09-07 LAB — GLUCOSE, CAPILLARY: GLUCOSE-CAPILLARY: 107 mg/dL — AB (ref 65–99)

## 2014-09-07 LAB — MRSA PCR SCREENING: MRSA BY PCR: NEGATIVE

## 2014-09-07 MED ORDER — APIXABAN 5 MG PO TABS: 5.0000 mg | ORAL_TABLET | Freq: Two times a day (BID) | ORAL | Status: DC

## 2014-09-07 MED ORDER — SODIUM CHLORIDE 0.9 % IJ SOLN
3.0000 mL | Freq: Two times a day (BID) | INTRAMUSCULAR | Status: DC
Start: 2014-09-07 — End: 2014-09-07
  Administered 2014-09-07 (×2): 3 mL via INTRAVENOUS

## 2014-09-07 MED ORDER — AMIODARONE HCL IN DEXTROSE 360-4.14 MG/200ML-% IV SOLN
60.0000 mg/h | INTRAVENOUS | Status: AC
  Administered 2014-09-07: 60 mg/h via INTRAVENOUS
  Filled 2014-09-07: qty 200

## 2014-09-07 MED ORDER — POTASSIUM CHLORIDE CRYS ER 20 MEQ PO TBCR: 40.0000 meq | EXTENDED_RELEASE_TABLET | Freq: Every day | ORAL | Status: DC

## 2014-09-07 MED ORDER — AMIODARONE HCL IN DEXTROSE 360-4.14 MG/200ML-% IV SOLN
30.0000 mg/h | INTRAVENOUS | Status: DC
  Administered 2014-09-07: 30 mg/h via INTRAVENOUS
  Filled 2014-09-07 (×4): qty 200

## 2014-09-07 MED ORDER — APIXABAN 5 MG PO TABS
5.0000 mg | ORAL_TABLET | Freq: Two times a day (BID) | ORAL | Status: DC
  Administered 2014-09-07: 5 mg via ORAL
  Filled 2014-09-07: qty 1

## 2014-09-07 MED ORDER — NITROFURANTOIN MONOHYD MACRO 100 MG PO CAPS
100.0000 mg | ORAL_CAPSULE | Freq: Two times a day (BID) | ORAL | Status: DC
  Administered 2014-09-07 (×2): 100 mg via ORAL
  Filled 2014-09-07 (×3): qty 1

## 2014-09-07 MED ORDER — APIXABAN 5 MG PO TABS
2.5000 mg | ORAL_TABLET | Freq: Two times a day (BID) | ORAL | Status: DC
  Administered 2014-09-07: 2.5 mg via ORAL
  Filled 2014-09-07: qty 1

## 2014-09-07 MED ORDER — ACETAMINOPHEN 325 MG PO TABS: 650.0000 mg | ORAL_TABLET | Freq: Four times a day (QID) | ORAL | Status: DC | PRN

## 2014-09-07 MED ORDER — ATORVASTATIN CALCIUM 10 MG PO TABS
10.0000 mg | ORAL_TABLET | Freq: Every day | ORAL | Status: DC
  Administered 2014-09-07: 10 mg via ORAL
  Filled 2014-09-07: qty 1

## 2014-09-07 MED ORDER — DEXTROSE 5 % IV SOLN: 60.0000 mg/h | INTRAVENOUS | Status: DC

## 2014-09-07 MED ORDER — DESMOPRESSIN ACETATE 0.1 MG PO TABS
0.1000 mg | ORAL_TABLET | Freq: Two times a day (BID) | ORAL | Status: DC
  Administered 2014-09-07 (×2): 0.1 mg via ORAL
  Filled 2014-09-07 (×3): qty 1

## 2014-09-07 MED ORDER — SODIUM BICARBONATE 650 MG PO TABS
650.0000 mg | ORAL_TABLET | Freq: Two times a day (BID) | ORAL | Status: DC
  Administered 2014-09-07 (×2): 650 mg via ORAL
  Filled 2014-09-07 (×3): qty 1

## 2014-09-07 MED ORDER — ACETAMINOPHEN 650 MG RE SUPP: 650.0000 mg | Freq: Four times a day (QID) | RECTAL | Status: DC | PRN

## 2014-09-07 MED ORDER — DILTIAZEM HCL ER COATED BEADS 120 MG PO CP24
120.0000 mg | ORAL_CAPSULE | Freq: Every day | ORAL | Status: DC
  Administered 2014-09-07: 120 mg via ORAL
  Filled 2014-09-07: qty 1

## 2014-09-07 MED ORDER — POTASSIUM CHLORIDE CRYS ER 20 MEQ PO TBCR
20.0000 meq | EXTENDED_RELEASE_TABLET | Freq: Every day | ORAL | Status: DC
  Administered 2014-09-07: 40 meq via ORAL
  Filled 2014-09-07 (×2): qty 1

## 2014-09-07 MED ORDER — HYDROCORTISONE 10 MG PO TABS
40.0000 mg | ORAL_TABLET | Freq: Every day | ORAL | Status: DC
  Administered 2014-09-07: 40 mg via ORAL
  Filled 2014-09-07: qty 4

## 2014-09-07 MED ORDER — LEVOTHYROXINE SODIUM 88 MCG PO TABS
88.0000 ug | ORAL_TABLET | Freq: Every day | ORAL | Status: DC
  Administered 2014-09-07: 88 ug via ORAL
  Filled 2014-09-07 (×2): qty 1

## 2014-09-07 MED ORDER — AMIODARONE HCL 200 MG PO TABS: ORAL_TABLET | ORAL | Status: DC

## 2014-09-07 MED ORDER — AMIODARONE HCL 200 MG PO TABS
400.0000 mg | ORAL_TABLET | Freq: Every day | ORAL | Status: DC
  Administered 2014-09-07: 400 mg via ORAL
  Filled 2014-09-07: qty 2

## 2014-09-07 NOTE — ED Notes (Signed)
Per ICU nurse Earna Coder, they are unable to accept pt b/c she is in observation.  Dr. Anne Hahn paged to change order.  Transport delayed.

## 2014-09-07 NOTE — Discharge Summary (Signed)
[facility] [place] [facility]   PATIENT NAME: Natalie Rollins    MR#:  [id number]  DATE OF BIRTH:  1929-06-16  DATE OF ADMISSION:  09/06/2014 ADMITTING PHYSICIAN: Oralia Manis, MD  DATE OF DISCHARGE: 09/07/14  PRIMARY CARE PHYSICIAN: Vonita Moss, MD    ADMISSION DIAGNOSIS:  Near syncope [R55] Atrial fibrillation, rapid [I48.91] Hypotension, unspecified hypotension type [I95.9]  DISCHARGE DIAGNOSIS:  Acute on chronic rapid afib-HR in the 60's, now on amiodarone Chronic anticoagulation with po eliquis Dehydration-resolved SECONDARY DIAGNOSIS:   Past Medical History  Diagnosis Date  . PAF (paroxysmal atrial fibrillation)     a. unknown if PAF vs persistent at this time given her travel history; b. on ; c. history of post op a-fib 07/2010  . Pituitary mass     a. 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 History of stroke x 2    a. left internal capsule CVA 07/2010; b. right stroke in right basal ganglia 2013  . HLD (hyperlipidemia)   . Hypothyroidism   . Arthritis     a. s/p lumbar fusion c/b broken hardware w/ removal of the fusion & subsequent revision most recently 07/2010  . Recurrent UTI   . Hydrocephalus     has a shunt placed   . Stroke     x 2    HOSPITAL COURSE:  81 y.o. female who presents with altered mental status and near-syncope. Brought to the ED by her husband who states that she became weak and lethargic at home. He brought her to the ED where she was found to be in A. fib with 6RVR, with a heart rate in the 140s. She was also minimally responsive at that time  *Atrial fibrillation with RVR - requiring amiodarone drip, she was mildly hypotensive when she first came in, but this is improved with fluids.  - continue amiodarone drip for now for rate control -spoke with Alycia Rossetti, [place] [facility] and recommends Amiodarone po taper and recommends increase elquis to 5 mg bid -HR in the 60's.  Pt asymptomatic -cont po ditiazem  * Near syncope - Most likely due to some dehydration and continuous blood pressure from her panhypopituitarism, in conjunction with her episode of A. fib with RVR.less likely stroke given the fact the patient has returned so quickly to her baseline functioning.  -pt back to baseline. Creat back to normal after IVF  *UTI (lower urinary tract infection) - Patient was diagnosed with this 5 days ago, we will continue her nitrofurantoin as her urine culture was susceptible to this   * Panhypopituitarism - Continue home blood pressure support medications   * History of stroke - Patient is on Eliquis for chronic anticoagulation, will continue this while here   * HLD (hyperlipidemia) - continue home statin  Overall improved. Will replace her K and ambulate her. If remains stable will d/c her to go home   DISCHARGE CONDITIONS:   fair CONSULTS OBTAINED:  Treatment Team:  Tonny Bollman, MD  DRUG ALLERGIES:  No Known Allergies  DISCHARGE MEDICATIONS:   Current Discharge Medication List    START taking these medications   Details  amiodarone (PACERONE) 200 MG tablet Label and dispense accordingly 400 mg bid x 1 week starting 09/07/14..followed by 200 mg bid x1..followed by 200 mg daily Qty: 60 tablet, Refills: 0      CONTINUE these medications which have CHANGED   Details  apixaban (ELIQUIS) 5 MG TABS tablet  Take 1 tablet (5 mg total) by mouth 2 (two) times daily. Qty: 60 tablet, Refills: 0      CONTINUE these medications which have NOT CHANGED   Details  Ascorbic Acid (VITAMIN C) 1000 MG tablet Take 1,000 mg by mouth daily.     atorvastatin (LIPITOR) 10 MG tablet Take 1 tablet by mouth daily.    Calcium-Magnesium-Zinc 333-133-5 MG TABS Take by mouth daily.     Cholecalciferol (VITAMIN D) 2000 UNITS tablet Take 2,000 Units by mouth daily.    desmopressin (DDAVP) 0.1 MG tablet Take 0.1 mg by mouth 2 (two) times daily.     diltiazem  (CARDIZEM CD) 120 MG 24 hr capsule Take 120 mg by mouth daily. Refills: 1    hydrocortisone (CORTEF) 10 MG tablet Take 40 mg by mouth daily.  Refills: 1    levothyroxine (SYNTHROID, LEVOTHROID) 88 MCG tablet Take 88 mcg by mouth daily before breakfast.    Misc Natural Products (GLUCOSAMINE CHONDROITIN ADV) TABS Take by mouth 2 (two) times daily.     Multiple Vitamin (MULTIVITAMIN) tablet Take 1 tablet by mouth daily.    nitrofurantoin, macrocrystal-monohydrate, (MACROBID) 100 MG capsule Take 1 capsule (100 mg total) by mouth 2 (two) times daily. Qty: 14 capsule, Refills: 0    potassium chloride (K-DUR,KLOR-CON) 10 MEQ tablet Take 10 mEq by mouth daily. Refills: 0    sodium bicarbonate 650 MG tablet Take 650 mg by mouth 2 (two) times daily. Refills: 1    vitamin E 400 UNIT capsule Take 400 Units by mouth daily.         If you experience worsening of your admission symptoms, develop shortness of breath, life threatening emergency, suicidal or homicidal thoughts you must seek medical attention immediately by calling 911 or calling your MD immediately  if symptoms less severe.  You Must read complete instructions/literature along with all the possible adverse reactions/side effects for all the Medicines you take and that have been prescribed to you. Take any new Medicines after you have completely understood and accept all the possible adverse reactions/side effects.   Please note  You were cared for by a hospitalist during your hospital stay. If you have any questions about your discharge medications or the care you received while you were in the hospital after you are discharged, you can call the unit and asked to speak with the hospitalist on call if the hospitalist that took care of you is not available. Once you are discharged, your primary care physician will handle any further medical issues. Please note that NO REFILLS for any discharge medications will be authorized once you are  discharged, as it is imperative that you return to your primary care physician (or establish a relationship with a primary care physician if you do not have one) for your aftercare needs so that they can reassess your need for medications and monitor your lab values. Today   SUBJECTIVE   Feels ok. Not sure why she is here  VITAL SIGNS:  Blood pressure 125/72, pulse 60, temperature 97.6 F (36.4 C), temperature source Oral, resp. rate 18, height 5\' 3"  (1.6 m), weight 76.204 kg (168 lb), SpO2 100 %.  I/O:   Intake/Output Summary (Last 24 hours) at 09/07/14 1030 Last data filed at 09/07/14 0600  Gross per 24 hour  Intake 305.81 ml  Output    200 ml  Net 105.81 ml    PHYSICAL EXAMINATION:  GENERAL:  81 y.o.-year-old patient lying in the bed with no  acute distress.  EYES: Pupils equal, round, reactive to light and accommodation. No scleral icterus. Extraocular muscles intact.  HEENT: Head atraumatic, normocephalic. Oropharynx and nasopharynx clear.  NECK:  Supple, no jugular venous distention. No thyroid enlargement, no tenderness.  LUNGS: Normal breath sounds bilaterally, no wheezing, rales,rhonchi or crepitation. No use of accessory muscles of respiration.  CARDIOVASCULAR: S1, S2 normal. No murmurs, rubs, or gallops.  ABDOMEN: Soft, non-tender, non-distended. Bowel sounds present. No organomegaly or mass.  EXTREMITIES: No pedal edema, cyanosis, or clubbing.  NEUROLOGIC: Cranial nerves II through XII are intact. Muscle strength 5/5 in all extremities. Sensation intact. Gait not checked.  PSYCHIATRIC: The patient is alert and oriented x 2 SKIN: No obvious rash, lesion, or ulcer.   DATA REVIEW:   CBC   Recent Labs Lab 09/07/14 0914  WBC 9.0  HGB 13.6  HCT 41.6  PLT 152    Chemistries   Recent Labs Lab 09/07/14 0530 09/07/14 0914  NA 144  --   K 3.8 2.9*  CL 114*  --   CO2 19*  --   GLUCOSE 93  --   BUN 14  --   CREATININE 0.83  --   CALCIUM 8.6*  --      Microbiology Results   Recent Results (from the past 240 hour(s))  Urine culture     Status: None   Collection Time: 09/01/14  5:04 PM  Result Value Ref Range Status   Specimen Description URINE, CLEAN CATCH  Final   Special Requests NONE  Final   Culture >=100,000 COLONIES/mL ESCHERICHIA COLI  Final   Report Status 09/03/2014 FINAL  Final   Organism ID, Bacteria ESCHERICHIA COLI  Final      Susceptibility   Escherichia coli - MIC*    AMPICILLIN >=32 RESISTANT Resistant     CEFAZOLIN 8 SENSITIVE Sensitive     CEFTRIAXONE <=1 SENSITIVE Sensitive     CIPROFLOXACIN >=4 RESISTANT Resistant     GENTAMICIN >=16 RESISTANT Resistant     IMIPENEM <=0.25 SENSITIVE Sensitive     NITROFURANTOIN 32 SENSITIVE Sensitive     TRIMETH/SULFA >=320 RESISTANT Resistant     PIP/TAZO Value in next row Sensitive      SENSITIVE<=4    * >=100,000 COLONIES/mL ESCHERICHIA COLI  MRSA PCR Screening     Status: None   Collection Time: 09/06/14  7:05 PM  Result Value Ref Range Status   MRSA by PCR NEGATIVE NEGATIVE Final    Comment:        The GeneXpert MRSA Assay (FDA approved for NASAL specimens only), is one component of a comprehensive MRSA colonization surveillance program. It is not intended to diagnose MRSA infection nor to guide or monitor treatment for MRSA infections.     RADIOLOGY:  Ct Head Wo Contrast  09/06/2014   CLINICAL DATA:  Patient became weak acutely 1 hour ago. Diaphoresis. Code stroke.  EXAM: CT HEAD WITHOUT CONTRAST  TECHNIQUE: Contiguous axial images were obtained from the base of the skull through the vertex without intravenous contrast.  COMPARISON:  History of sellar and suprasellar meningioma.  FINDINGS: The brain shows generalized atrophy. There are extensive chronic small vessel ischemic changes throughout both cerebral hemispheres. Heavily calcified sellar and suprasellar meningioma is unchanged from the previous studies. VP shunt placed from a right posterior approach  is unchanged with its tip in the lateral ventricular system. No change in ventricular size. 6 mm calcified meningioma in the left parietal convexity is unchanged.  IMPRESSION: No change.  No acute finding. Atrophy and chronic small vessel disease. Skullbase meningioma. VP shunt. No evidence of shunt malfunction.  These results were called by telephone at the time of interpretation on 09/06/2014 at 7:45 pm to Dr. Sharyn Creamer , who verbally acknowledged these results.   Electronically Signed   By: Paulina Fusi M.D.   On: 09/06/2014 19:49   Dg Chest Port 1 View  09/06/2014   CLINICAL DATA:  Altered mental status  EXAM: PORTABLE CHEST - 1 VIEW  COMPARISON:  05/06/2014  FINDINGS: No cardiomegaly and stable aortic tortuosity. There is a ventriculoperitoneal shunt catheter which traverses the right chest, contiguous where visualized.  There is no edema, consolidation, effusion, or pneumothorax.  Remote but likely partially healed right humeral neck fracture. Extensive thoracolumbar spinal fixation.  IMPRESSION: No active disease.   Electronically Signed   By: Marnee Spring M.D.   On: 09/06/2014 21:04     Management plans discussed with the patient, family and they are in agreement. Spoke with Ryan-PA, Cardiology  CODE STATUS:     Code Status Orders        Start     Ordered   09/07/14 0105  Full code   Continuous     09/07/14 0104    Advance Directive Documentation        Most Recent Value   Type of Advance Directive  Healthcare Power of Attorney, Living will   Pre-existing out of facility DNR order (yellow form or pink MOST form)     "MOST" Form in Place?        TOTAL TIME TAKING CARE OF THIS PATIENT: 40  minutes.    Denice Cardon M.D on 09/07/2014 at 10:30 AM  Between 7am to 6pm - Pager - [phone] After 6pm go to [email or web address] - password [email or web address] [facility]  [facility]  Office  [phone]  CC: Primary care physician; Vonita Moss, MD

## 2014-09-07 NOTE — Consult Note (Signed)
Cardiology Consultation Note  Patient ID: Natalie Rollins, MRN: [id number], DOB/AGE: 79-Jun-1931 79 y.o. Admit date: 09/06/2014   Date of Consult: 09/07/2014 Primary Physician: Vonita Moss, MD Primary Cardiologist: Dr. Mariah Milling, MD  Chief Complaint: Altered mental status Reason for Consult: Afib with RVR  HPI: 81 y.o. 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   She and her husband live both in [place] and [place] [employer], [place]. She has history a brief episode of post-op a-fib back in 07/2010. At that time she was not placed on long term full dose anticoagulation. No prior ischemic evaluations.   She has a history of intermittent unresponsiveness. Her husband sometimes calls EMS and sometimes doesn't, based on her presentation. In the setting of most recent stroke in 2013 echo showed normal LV/RV function, mild MR, trivial TR, negative saline bubble study. Her aspirin was increased from 81 mg daily to 325 mg daily at that time.   While in [place] in March she developed another unresponsive episode and was taken to the hospital. She was diagnosed with new onset a-fib with RVR. Her husband reports she was treated with Cardizem CD 120 mg, Multaq, and Eliquis. Husband does not know if she was discharged in NSR or a-fib. She was discharged to rehab x 1 week. Upon being dicharged from rehab she was  sent home with 6 prescriptions that were taken to [facility]. Only 4 have been filled 2/2 insurance policy. At home she is not on long term full dose anticoagulation, Cardizem CD, or Multaq. She has been taking aspirin daily.   She was admitted to [facility] in April 2016 with repeat episode of altered mental status and an unresponsiveness. She developed Afib with RVR. She was found to have a UTI, c/w with prior episodes. She was successfully treated and discharged on Cardizem CD 120 mg daily and Eliquis 2.5 mg bid. In hospital follow up she was doing well and continued on current medications. She was in NSR at that time.   She was recently treated for UTI at outside urgent care on 09/01/2014. She was treated with Macrobid. Urine culture grew E coli.   She presented to [facility] on 8/20 with AMS and presyncope at home and was brought in by her husband. She was found to be in Afib with RVR in the 140s. She reports decreased PO fluid intake at home, drinking no more than 2 glasses of water daily. She received IV fluids with improved responsiveness to her baselines. She denied any chest pain, palpitations, SOB, nausea, vomiting, or diaphoresis. No LEE or orthopnea. She has been taking her medications daily as directed. Labs showed a troponin negative x 1. Ethanol of 10. WBC 13.3. K+ 3.4. Improved UA. EKG showed Afib with RVR, 136 bpm, lateral st/t changes. CXR with no acute process. CT head with no changes. She was mildly hypotensive upon arrival at 104/70, thus she was placed on a amiodarone gtt. She converted to NSR at 4:14 AM on 8/21 and  has remained there since with heart rates in the 60s. She is asymptomatic at this time and wants to go home.     Past Medical History  Diagnosis Date  . Atrial fibrillation     a. unknown if PAF vs persistent at this time given her travel history; b. on ; c. history of post op a-fib 07/2010  . Pituitary mass     a. 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 History of stroke x 2    a. left internal capsule CVA 07/2010; b. right stroke in right basal ganglia 2013  . HLD (hyperlipidemia)   . Hypothyroidism   . Arthritis     a. s/p lumbar fusion c/b broken hardware w/ removal of the fusion & subsequent revision most recently 07/2010  . Recurrent UTI   . Hydrocephalus     has a shunt placed   . Stroke     x 2      Most Recent Cardiac Studies: Echo 05/06/2014:  Summary:  1. Rhythm is atrial fibrillation. Challenging image quality  2. Left ventricular ejection fraction, by visual estimation, is 55 to  60%.  3. Normal global left ventricular systolic function.  4. Mild left ventricular hypertrophy.  5. Normal right ventricular size and systolic function.  6. Mildly dilated left atrium.  7. Valves not well visualized though grossly appear intact  8. Normal RVSP    Surgical History:  Past Surgical History  Procedure Laterality Date  . Spine surgery      metal rods places   . Patella fracture surgery      left   . Tibia fracture surgery      right  . Humerus surgery      right     Home Meds: Prior to Admission medications   Medication Sig Start Date End Date Taking? Authorizing Provider  Ascorbic Acid (VITAMIN C) 1000 MG tablet Take 1,000 mg by mouth daily.    Yes Historical Provider, MD  atorvastatin (LIPITOR) 10 MG tablet Take 1 tablet by mouth daily. 08/13/14  Yes Historical Provider, MD  Calcium-Magnesium-Zinc [phone] MG TABS Take by mouth daily.    Yes Historical Provider, MD  Cholecalciferol (VITAMIN D) 2000 UNITS tablet Take 2,000 Units by mouth daily.   Yes Historical Provider, MD  desmopressin (DDAVP) 0.1 MG tablet Take 0.1 mg by mouth 2 (two) times daily.    Yes Historical Provider, MD  diltiazem (CARDIZEM CD) 120 MG 24 hr capsule Take 120 mg by mouth daily. 05/30/14  Yes Historical Provider, MD  ELIQUIS 2.5 MG TABS tablet Take 2.5 mg by mouth 2 (two) times daily. 06/03/14  Yes Historical Provider, MD    hydrocortisone (CORTEF) 10 MG tablet Take 40 mg by mouth daily.  05/22/14  Yes Historical Provider, MD  levothyroxine (SYNTHROID, LEVOTHROID) 88 MCG tablet Take 88 mcg by mouth daily before breakfast.   Yes Historical Provider, MD  Misc Natural Products (GLUCOSAMINE CHONDROITIN ADV) TABS Take by mouth 2 (two) times daily.    Yes Historical Provider, MD  Multiple Vitamin (MULTIVITAMIN) tablet Take 1 tablet by mouth daily.   Yes Historical Provider, MD  nitrofurantoin, macrocrystal-monohydrate, (MACROBID) 100 MG capsule Take 1 capsule (100 mg total) by mouth 2 (two) times daily. 09/01/14  Yes Payton Mccallum, MD  potassium chloride (K-DUR,KLOR-CON) 10 MEQ tablet Take 10 mEq by mouth daily. 04/21/14  Yes Historical Provider, MD  sodium bicarbonate 650 MG tablet Take 650 mg by  mouth 2 (two) times daily. 05/30/14  Yes Historical Provider, MD  vitamin E 400 UNIT capsule Take 400 Units by mouth daily.    Yes Historical Provider, MD    Inpatient Medications:  . apixaban  2.5 mg Oral BID  . atorvastatin  10 mg Oral QHS  . desmopressin  0.1 mg Oral BID  . diltiazem  120 mg Oral Daily  . hydrocortisone  40 mg Oral Daily  . levothyroxine  88 mcg Oral QAC breakfast  . nitrofurantoin (macrocrystal-monohydrate)  100 mg Oral BID  . sodium bicarbonate  650 mg Oral BID  . sodium chloride  3 mL Intravenous Q12H   . amiodarone 30 mg/hr (09/07/14 0802)    Allergies: No Known Allergies  Social History   Social History  . Marital Status: Married    Spouse Name: N/A  . Number of Children: N/A  . Years of Education: N/A   Occupational History  . Not on file.   Social History Main Topics  . Smoking status: Never Smoker   . Smokeless tobacco: Not on file  . Alcohol Use: Yes  . Drug Use: No  . Sexual Activity: Not on file   Other Topics Concern  . Not on file   Social History Narrative     Family History  Problem Relation Age of Onset  . Family history unknown: Yes     Review of Systems: Review  of Systems  Constitutional: Positive for malaise/fatigue. Negative for fever, chills, weight loss and diaphoresis.  HENT: Negative for congestion.   Eyes: Negative for discharge and redness.  Respiratory: Negative for cough, hemoptysis, sputum production, shortness of breath and wheezing.   Cardiovascular: Negative for chest pain, palpitations, orthopnea, claudication, leg swelling and PND.  Gastrointestinal: Negative for heartburn, nausea, vomiting, blood in stool and melena.  Genitourinary: Negative for hematuria.  Musculoskeletal: Negative for falls.  Skin: Negative for rash.  Neurological: Positive for focal weakness and weakness. Negative for sensory change and speech change.  Psychiatric/Behavioral: Negative for hallucinations and substance abuse. The patient is not nervous/anxious.   All other systems reviewed and are negative.    Labs:  Recent Labs  09/06/14 1906  TROPONINI 0.03   Lab Results  Component Value Date   WBC 13.3* 09/06/2014   HGB 14.8 09/06/2014   HCT 45.4 09/06/2014   MCV 87.9 09/06/2014   PLT 203 09/06/2014    Recent Labs Lab 09/07/14 0530  NA 144  K 3.8  CL 114*  CO2 19*  BUN 14  CREATININE 0.83  CALCIUM 8.6*  GLUCOSE 93   Lab Results  Component Value Date   CHOL 106 05/07/2014   HDL 29* 05/07/2014   LDLCALC 55 05/07/2014   TRIG 109 05/07/2014   No results found for: DDIMER  Radiology/Studies:  Ct Head Wo Contrast  09/06/2014   CLINICAL DATA:  Patient became weak acutely 1 hour ago. Diaphoresis. Code stroke.  EXAM: CT HEAD WITHOUT CONTRAST  TECHNIQUE: Contiguous axial images were obtained from the base of the skull through the vertex without intravenous contrast.  COMPARISON:  History of sellar and suprasellar meningioma.  FINDINGS: The brain shows generalized atrophy. There are extensive chronic small vessel ischemic changes throughout both cerebral hemispheres. Heavily calcified sellar and suprasellar meningioma is unchanged from the  previous studies. VP shunt placed from a right posterior approach is unchanged with its tip in the lateral ventricular system. No change in ventricular size. 6 mm calcified meningioma in the left parietal convexity is unchanged.  IMPRESSION: No change. No acute finding. Atrophy and chronic small vessel disease. Skullbase meningioma. VP shunt. No evidence of shunt malfunction.  These results were called by telephone at the time of interpretation on 09/06/2014 at 7:45 pm to Dr. Sharyn Creamer , who verbally acknowledged these results.   Electronically Signed   By: Paulina Fusi M.D.   On: 09/06/2014 19:49   Dg Chest Port 1 View  09/06/2014   CLINICAL DATA:  Altered mental status  EXAM: PORTABLE CHEST - 1 VIEW  COMPARISON:  05/06/2014  FINDINGS: No cardiomegaly and stable aortic tortuosity. There is a ventriculoperitoneal shunt catheter which traverses the right chest, contiguous where visualized.  There is no edema, consolidation, effusion, or pneumothorax.  Remote but likely partially healed right humeral neck fracture. Extensive thoracolumbar spinal fixation.  IMPRESSION: No active disease.   Electronically Signed   By: Marnee Spring M.D.   On: 09/06/2014 21:04    EKG: afib with RVR, 136 bpm, lateral st/t changes   Weights: Filed Weights   09/06/14 1916  Weight: 168 lb (76.204 kg)     Physical Exam: Blood pressure 132/74, pulse 61, temperature 97.6 F (36.4 C), temperature source Oral, resp. rate 16, height 5\' 3"  (1.6 m), weight 168 lb (76.204 kg), SpO2 100 %. Body mass index is 29.77 kg/(m^2). General: Well developed, well nourished, in no acute distress. Head: Normocephalic, atraumatic, sclera non-icteric, no xanthomas, nares are without discharge.  Neck: Negative for carotid bruits. JVD not elevated. Lungs: Clear bilaterally to auscultation without wheezes, rales, or rhonchi. Breathing is unlabored. Heart: RRR with S1 S2. No murmurs, rubs, or gallops appreciated. Abdomen: Soft, non-tender,  non-distended with normoactive bowel sounds. No hepatomegaly. No rebound/guarding. No obvious abdominal masses. Msk:  Strength and tone appear normal for age. Extremities: No clubbing or cyanosis. No edema.  Distal pedal pulses are 2+ and equal bilaterally. Neuro: Alert and oriented X 3. No facial asymmetry. No focal deficit. Moves all extremities spontaneously. Psych:  Responds to questions appropriately with a normal affect.    Assessment and Plan:  81 y.o. 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 with after recently being diagnosed with UTI less than 1 week ago with altered mental status and and presyncope and was found to be in Afib with RVR, subsequently converted to sinus rhythm on amiodarone gtt.   1. Paroxysmal Afib with RVR: -Likely in the setting of her recent UTI and subsequent dehydration.  -She has history of going into Afib with RVR with UTI and she presented with elevated SCr of 1.19, since improved with IV hydration and self endorses limited PO fluid intake -Currently in sinus rhythm as of 4:14 AM on amiodarone gtt, she reports not missing any doses of her Eliquis 2.5 mg bid -Given her recurring UTI's which seem to lead to increased Afib burden and increased unresponsiveness in her she couldl ikely benefit from PO amiodarone at this time to decrease her Afib burden  -Stop amiodarone gtt -Start PO amiodarone 400 mg bid x 1 week, 200 mg bid x 1 week, then 200 mg daily -Continue  Cardizem CD  120 mg daily -Per dosing guidelines patient should be on Eliquis 5 mg bid (she only has 1 of 3 criteria that meets the 2.5 mg decreased dosage - age, the other requirements are weight <60 kg and SCr >1.50 which she does not meet) -Would increase patient to Eliquis to 5 mg bid per evidenced based guidelines  -CHADSVASc at least 5 (age x 2, stroke x 2, female) giving her an estimated annual stroke risk of 6.7%  2. Presyncope: -This is a recurring issue for her in the setting of her recurring UTI's -Recent echo in April 2016 showed normal LV function with no WMA, no need for repeat echo at this time -No anginal symptoms -Her symptoms greatly improved with IV hydration -Her prior symptoms have improved with antibiotics -Unlikely cardiogenic based on the above -MRI unchanged -Per IM  3. UTI/dehydration: -Likely leading to #2 -Per IM   4. History of stroke with residual weakness: -No acute findings -Continue home medications  5. Hypokalemia: -Likely exacerbating #1 -Replete to 4.0 -Specimen this morning possibly hemolyzed     Signed, Eula Listen, PA-C Pager: [phone] 09/07/2014, 8:34 AM  Patient seen, examined. Available data reviewed. Agree with findings, assessment, and plan as outlined by Eula Listen, PA-C. The patient is independently interviewed and examined. She is an elderly woman in no acute distress. Lung fields are clear. Heart is regular rate and rhythm without murmur or gallop. Abdomen is soft and nontender. There is no peripheral edema. Telemetry demonstrates conversion from atrial fibrillation with RVR into normal sinus rhythm at approximately 4 AM today. Agree with conversion from intravenous to oral amiodarone as outlined above. She remains anticoagulated with Eliquis. Will arrange outpatient follow-up. Considering her recurrent urinary tract infections, will need to be cautious with the use of fluoroquinolone antibiotics as this can cause significant QT prolongation on  a background of amiodarone therapy. Will arrange outpatient follow-up. thx  Tonny Bollman, M.D. 09/07/2014 12:21 PM

## 2014-09-07 NOTE — Progress Notes (Signed)
Pt discharged to care of husband.  IV's Discontinued, cath intact.  Pt dressed in paper scrubs, in wheelchair and care surrendered to  Husband.

## 2014-09-09 ENCOUNTER — Encounter: Payer: Self-pay | Admitting: Emergency Medicine

## 2014-09-09 ENCOUNTER — Ambulatory Visit
Admission: EM | Admit: 2014-09-09 | Discharge: 2014-09-09 | Disposition: A | Discharge status: Home / Self Care | Payer: Medicare Other | Attending: Family Medicine | Admitting: Family Medicine

## 2014-09-09 DIAGNOSIS — S50812A Abrasion of left forearm, initial encounter: Secondary | ICD-10-CM

## 2014-09-09 DIAGNOSIS — S50812D Abrasion of left forearm, subsequent encounter: Secondary | ICD-10-CM

## 2014-09-09 LAB — URINE CULTURE: CULTURE: NO GROWTH

## 2014-09-09 NOTE — ED Notes (Signed)
Pt to have a dressing change to left arm

## 2014-09-09 NOTE — ED Provider Notes (Signed)
CSN: [id number]     Arrival date & time 09/09/14  1308 History   First MD Initiated Contact with Patient 09/09/14 1338     Chief Complaint  Patient presents with  . Dressing Change   (Consider location/radiation/quality/duration/timing/severity/associated sxs/prior Treatment) HPI Comments: 81 yo female recently hospitalized 4 days ago with pre-syncope. Sustained left arm abrasions and patient is brought in today by husband who is caretaker for left arm dressing change. Denies fevers, chills, redness.  The history is provided by the patient.    Past Medical History  Diagnosis Date  . PAF (paroxysmal atrial fibrillation)     a. unknown if PAF vs persistent at this time given her travel history; b. on ; c. history of post op a-fib 07/2010  . Pituitary mass     a. 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 History of stroke x 2    a. left internal capsule CVA 07/2010; b. right stroke in right basal ganglia 2013  . HLD (hyperlipidemia)   . Hypothyroidism   . Arthritis     a. s/p lumbar fusion c/b broken hardware w/ removal of the fusion & subsequent revision most recently 07/2010  . Recurrent UTI   . Hydrocephalus     has a shunt placed   . Stroke     x 2   Past Surgical History  Procedure Laterality Date  . Spine surgery      metal rods places   . Patella fracture surgery      left   . Tibia fracture surgery      right  . Humerus surgery      right   Family History  Problem Relation Age of Onset  . Family history unknown: Yes   Social History  Substance Use Topics  . Smoking status: Never Smoker   . Smokeless tobacco: None  . Alcohol Use: Yes   OB History    No data available     Review of Systems  Allergies  Review of patient's allergies indicates no known allergies.  Home Medications   Prior to Admission medications   Medication Sig Start Date End Date Taking? Authorizing Provider  amiodarone (PACERONE) 200 MG  tablet Label and dispense accordingly 400 mg bid x 1 week starting 09/07/14..followed by 200 mg bid x1..followed by 200 mg daily 09/07/14   Enedina Finner, MD  apixaban (ELIQUIS) 5 MG TABS tablet Take 1 tablet (5 mg total) by mouth 2 (two) times daily. 09/07/14   Enedina Finner, MD  Ascorbic Acid (VITAMIN C) 1000 MG tablet Take 1,000 mg by mouth daily.     Historical Provider, MD  atorvastatin (LIPITOR) 10 MG tablet Take 1 tablet by mouth daily. 08/13/14   Historical Provider, MD  Calcium-Magnesium-Zinc [phone] MG TABS Take by mouth daily.     Historical Provider, MD  Cholecalciferol (VITAMIN D) 2000 UNITS tablet Take 2,000 Units by mouth daily.    Historical Provider, MD  desmopressin (DDAVP) 0.1 MG tablet Take 0.1 mg by mouth 2 (two) times daily.     Historical Provider, MD  diltiazem (CARDIZEM CD) 120 MG 24 hr capsule Take 120 mg by mouth daily. 05/30/14   Historical Provider, MD  hydrocortisone (CORTEF) 10 MG tablet Take 40 mg by mouth daily.  05/22/14   Historical Provider, MD  levothyroxine (SYNTHROID, LEVOTHROID) 88 MCG tablet Take 88 mcg by mouth daily before breakfast.    Historical Provider, MD  Misc Natural Products (GLUCOSAMINE  CHONDROITIN ADV) TABS Take by mouth 2 (two) times daily.     Historical Provider, MD  Multiple Vitamin (MULTIVITAMIN) tablet Take 1 tablet by mouth daily.    Historical Provider, MD  nitrofurantoin, macrocrystal-monohydrate, (MACROBID) 100 MG capsule Take 1 capsule (100 mg total) by mouth 2 (two) times daily. 09/01/14   Payton Mccallum, MD  potassium chloride (K-DUR,KLOR-CON) 10 MEQ tablet Take 10 mEq by mouth daily. 04/21/14   Historical Provider, MD  sodium bicarbonate 650 MG tablet Take 650 mg by mouth 2 (two) times daily. 05/30/14   Historical Provider, MD  vitamin E 400 UNIT capsule Take 400 Units by mouth daily.     Historical Provider, MD   BP 136/62 mmHg  Pulse 75  Temp(Src) 98 F (36.7 C) (Oral)  Resp 18  SpO2 99% Physical Exam  Constitutional: She appears  well-developed and well-nourished. No distress.  Skin: She is not diaphoretic.  2 approximately 6cm large skin abrasions on left forearm proximal forearm area; slightly bleeding; non-tender; no surrounding erythema; normal range of motion of left forearm, hand and neurovascularly intact  Nursing note and vitals reviewed.   ED Course  Procedures (including critical care time) Labs Review Labs Reviewed - No data to display  Imaging Review No results found.   MDM   1. Abrasion of left forearm, subsequent encounter    Plan: 1. diagnosis reviewed with patient's husband, who is caretaker; area cleaned and dressing changed 2. Monitor area for signs of infection and continue dressing changes 3. F/u prn if symptoms worsen or don't improve    Payton Mccallum, MD 09/09/14 1400

## 2014-09-15 DIAGNOSIS — M62838 Other muscle spasm: Secondary | ICD-10-CM | POA: Insufficient documentation

## 2014-09-15 DIAGNOSIS — G8929 Other chronic pain: Secondary | ICD-10-CM | POA: Insufficient documentation

## 2014-09-15 DIAGNOSIS — J309 Allergic rhinitis, unspecified: Secondary | ICD-10-CM | POA: Insufficient documentation

## 2014-09-16 ENCOUNTER — Encounter: Payer: Self-pay | Admitting: Unknown Physician Specialty

## 2014-09-16 ENCOUNTER — Ambulatory Visit (INDEPENDENT_AMBULATORY_CARE_PROVIDER_SITE_OTHER): Payer: Medicare Other | Admitting: Unknown Physician Specialty

## 2014-09-16 VITALS — HR 72 | Temp 98.4°F | Wt 168.6 lb

## 2014-09-16 DIAGNOSIS — S40812D Abrasion of left upper arm, subsequent encounter: Secondary | ICD-10-CM

## 2014-09-16 DIAGNOSIS — S40811D Abrasion of right upper arm, subsequent encounter: Secondary | ICD-10-CM | POA: Diagnosis not present

## 2014-09-16 DIAGNOSIS — R55 Syncope and collapse: Secondary | ICD-10-CM | POA: Diagnosis not present
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
## 2014-09-23 ENCOUNTER — Ambulatory Visit: Payer: Self-pay | Admitting: Unknown Physician Specialty

## 2014-09-25 ENCOUNTER — Ambulatory Visit: Payer: Medicare Other | Admitting: Cardiovascular Disease

## 2014-10-24 ENCOUNTER — Other Ambulatory Visit: Payer: Self-pay | Admitting: Unknown Physician Specialty

## 2014-10-29 ENCOUNTER — Ambulatory Visit (INDEPENDENT_AMBULATORY_CARE_PROVIDER_SITE_OTHER): Payer: Medicare Other | Admitting: Unknown Physician Specialty

## 2014-10-29 ENCOUNTER — Encounter: Payer: Self-pay | Admitting: Unknown Physician Specialty

## 2014-10-29 VITALS — BP 114/65 | HR 91 | Temp 97.7°F | Ht 64.0 in | Wt 168.0 lb

## 2014-10-29 DIAGNOSIS — M545 Low back pain, unspecified: Secondary | ICD-10-CM

## 2014-10-29 DIAGNOSIS — M791 Myalgia: Secondary | ICD-10-CM

## 2014-10-29 DIAGNOSIS — Z23 Encounter for immunization: Secondary | ICD-10-CM

## 2014-10-29 DIAGNOSIS — N3000 Acute cystitis without hematuria: Secondary | ICD-10-CM | POA: Diagnosis not present

## 2014-10-29 DIAGNOSIS — M7918 Myalgia, other site: Secondary | ICD-10-CM

## 2014-10-29 LAB — MICROSCOPIC EXAMINATION: RENAL EPITHEL UA: NONE SEEN /HPF

## 2014-10-29 MED ORDER — HYDROCODONE-ACETAMINOPHEN 5-325 MG PO TABS: 1.0000 | ORAL_TABLET | Freq: Four times a day (QID) | ORAL | Status: DC | PRN

## 2014-10-29 MED ORDER — NITROFURANTOIN MONOHYD MACRO 100 MG PO CAPS: 100.0000 mg | ORAL_CAPSULE | Freq: Two times a day (BID) | ORAL | Status: DC

## 2014-10-29 NOTE — Progress Notes (Signed)
   BP 114/65 mmHg  Pulse 91  Temp(Src) 97.7 F (36.5 C)  Ht 5\' 4"  (1.626 m)  Wt 168 lb (76.204 kg)  BMI 28.82 kg/m2  SpO2 95%   Subjective:    Patient ID: Natalie Rollins, female    DOB: 11/12/1929, 81 y.o.   MRN: [id number]  HPI: Natalie Rollins is a 81 y.o. female  Chief Complaint  Patient presents with  . Pain    pt states she has pain in buttock area, states pain is all over buttock area   This has been going on for 3 days.  Makes it worse to walk.  Feels better if lying down.  She is currently spends 18-20 hours in bed.  No fever or chills.  No falls.    She does get a lot of UTIs and husband thinbks she might have another one.    Relevant past medical, surgical, family and social history reviewed and updated as indicated. Interim medical history since our last visit reviewed. Allergies and medications reviewed and updated.  Review of Systems  Per HPI unless specifically indicated above     Objective:    BP 114/65 mmHg  Pulse 91  Temp(Src) 97.7 F (36.5 C)  Ht 5\' 4"  (1.626 m)  Wt 168 lb (76.204 kg)  BMI 28.82 kg/m2  SpO2 95%  Wt Readings from Last 3 Encounters:  10/29/14 168 lb (76.204 kg)  09/16/14 168 lb 9.6 oz (76.476 kg)  05/13/14 163 lb (73.936 kg)    Physical Exam  Constitutional: Vital signs are normal. She appears well-developed and well-nourished. She appears listless. She does not appear ill. No distress.  HENT:  Head: Normocephalic and atraumatic.  Eyes: Conjunctivae and lids are normal. Right eye exhibits no discharge. Left eye exhibits no discharge. No scleral icterus.  Cardiovascular: Normal rate and regular rhythm.   Pulmonary/Chest: Effort normal. No respiratory distress.  Abdominal: Normal appearance. There is no splenomegaly or hepatomegaly.  Musculoskeletal: Normal range of motion.  Tender center of right buttock.  No leg or groin pain  Neurological: She appears listless.  Dementia  Skin: Skin is intact. No rash noted. No  pallor.  Psychiatric: Judgment normal.   Urine positive  Assessment & Plan:   Problem List Items Addressed This Visit    None    Visit Diagnoses    Immunization due    -  Primary    Relevant Orders    Flu Vaccine QUAD 36+ mos PF IM (Fluarix & Fluzone Quad PF) (Completed)    Right-sided low back pain without sciatica        Relevant Medications    HYDROcodone-acetaminophen (NORCO/VICODIN) 5-325 MG tablet    Other Relevant Orders    UA/M w/rflx Culture, Routine    Piriformis muscle pain        Discussed PT and husband states she won't cooperate.  Limited rx for Hydrocodone with cautjion about falls.    Acute cystitis without hematuria        Rx fro Macrobid.         Follow up plan: Return if symptoms worsen or fail to improve.

## 2014-11-05 LAB — URINE CULTURE, REFLEX

## 2014-11-10 ENCOUNTER — Encounter: Payer: Self-pay | Admitting: Family Medicine

## 2014-11-10 ENCOUNTER — Ambulatory Visit (INDEPENDENT_AMBULATORY_CARE_PROVIDER_SITE_OTHER): Payer: Medicare Other | Admitting: Family Medicine

## 2014-11-10 VITALS — BP 122/81 | HR 80 | Temp 97.8°F | Ht 64.0 in | Wt 168.0 lb

## 2014-11-10 DIAGNOSIS — F0280 Dementia in other diseases classified elsewhere without behavioral disturbance: Secondary | ICD-10-CM

## 2014-11-10 DIAGNOSIS — N390 Urinary tract infection, site not specified: Secondary | ICD-10-CM | POA: Diagnosis not present

## 2014-11-10 DIAGNOSIS — G309 Alzheimer's disease, unspecified: Secondary | ICD-10-CM | POA: Diagnosis not present

## 2014-11-10 DIAGNOSIS — M25551 Pain in right hip: Secondary | ICD-10-CM | POA: Diagnosis not present

## 2014-11-10 LAB — UA/M W/RFLX CULTURE, ROUTINE

## 2014-11-10 MED ORDER — AMOXICILLIN 500 MG PO CAPS: 500.0000 mg | ORAL_CAPSULE | Freq: Two times a day (BID) | ORAL | Status: DC

## 2014-11-10 NOTE — Assessment & Plan Note (Signed)
We'll get an x-ray of her hip and make an assessment after x-ray

## 2014-11-10 NOTE — Assessment & Plan Note (Signed)
Patient with no UTI complaints but on review culture not sensitive to Macrodantin will change to Amoxil

## 2014-11-10 NOTE — Assessment & Plan Note (Signed)
Apparently stable 

## 2014-11-10 NOTE — Progress Notes (Signed)
BP 122/81 mmHg  Pulse 80  Temp(Src) 97.8 F (36.6 C)  Ht  (1.626 m)  Wt 168 lb (76.204 kg)  BMI 28.82 kg/m2  SpO2 98%   Subjective:    Patient ID: Natalie Rollins, female    DOB: Dec 26, 1929, 81 y.o.   MRN: [id number]  HPI: Natalie Rollins is a 81 y.o. female  Chief Complaint  Patient presents with  . Pain    pt's husband states that the patient has severe pain in the right buttock that started about 10 days ago   Patient presents with confusion history difficult to elicit because of hearing loss and having to shout. Patient is accompanied by her husband who provides nearly all of the history. Patient's first concern is a note of mental competency day work that the family is trying to do. Patient's not oriented to date months or season. She is unable to state that we are in [place], or in Dr. Christell Faith office Score Mini-Mental status exam was 21. With this information and previously confirmed diagnosis of Alzheimer's I informed the husband that we would not be able to produce any paperwork saying she is mentally competent.  Husband relates no history of fall or hip trauma but patient is now in a wheelchair gets around her home with difficulty and assistance with very limited walking. Husband states there is no rash or discoloration of hip area Also confusing history of having been to the emergency room with no evidence of chart review On further discussion patient went to the emergency room discussed with intake person who sent him to our office. Relevant past medical, surgical, family and social history reviewed and updated as indicated. Interim medical history since our last visit reviewed. Allergies and medications reviewed and updated. According to husband Review of Systems  Constitutional: Negative for chills and diaphoresis.  HENT: Negative.   Respiratory: Negative.   Cardiovascular: Negative.     Per HPI unless specifically indicated above      Objective:    BP 122/81 mmHg  Pulse 80  Temp(Src) 97.8 F (36.6 C)  Ht  (1.626 m)  Wt 168 lb (76.204 kg)  BMI 28.82 kg/m2  SpO2 98%  Wt Readings from Last 3 Encounters:  11/10/14 168 lb (76.204 kg)  10/29/14 168 lb (76.204 kg)  09/16/14 168 lb 9.6 oz (76.476 kg)    Physical Exam  Constitutional: She appears well-developed and well-nourished.  HENT:  Head: Normocephalic.  Eyes: Conjunctivae are normal. Pupils are equal, round, and reactive to light.  Neck: Normal range of motion.  Cardiovascular: Normal rate, regular rhythm and normal heart sounds.   Pulmonary/Chest: Breath sounds normal.  Abdominal: Soft. Bowel sounds are normal.  Musculoskeletal:  Limited exam this was unable to get patient out of the chair but marked tenderness over greater trochanter  Skin: Skin is warm and dry.  Reported by husband no rash in area back or hip    Results for orders placed or performed in visit on 10/29/14  Microscopic Examination  Result Value Ref Range   WBC, UA >30 (A) 0 -  5 /hpf   RBC, UA 0-2 0 -  2 /hpf   Epithelial Cells (non renal) 0-10 0 - 10 /hpf   Renal Epithel, UA None seen None seen /hpf   Crystals Present (A) N/A   Crystal Type Triple Phosphate N/A   Mucus, UA Present Not Estab.   Bacteria, UA Moderate (A) None seen/Few  UA/M w/rflx Culture, Routine  Result Value Ref Range   Urine Culture, Routine Final report (A)    Urine Culture result 1 Proteus mirabilis (A)    RESULT 2 Comment    ANTIMICROBIAL SUSCEPTIBILITY Comment   Urine Culture, Routine  Result Value Ref Range   Urine Culture result 1 Gram negative rods (A)    RESULT 2 Comment       Assessment & Plan:   Problem List Items Addressed This Visit      Nervous and Auditory   AD (Alzheimer's disease)    Apparently stable        Genitourinary   UTI (lower urinary tract infection)    Patient with no UTI complaints but on review culture not sensitive to Macrodantin will change to Amoxil       Relevant Medications   amoxicillin (AMOXIL) 500 MG capsule     Other   Right hip pain    We'll get an x-ray of her hip and make an assessment after x-ray       Other Visit Diagnoses    Hip pain, right    -  Primary    Relevant Orders    DG Pelvis Comp Min 3V        Follow up plan: Return if symptoms worsen or fail to improve, for Pending on the x-ray.

## 2014-11-11 ENCOUNTER — Ambulatory Visit
Admission: RE | Admit: 2014-11-11 | Discharge: 2014-11-11 | Disposition: A | Discharge status: Home / Self Care | Payer: Medicare Other | Source: Ambulatory Visit | Attending: Family Medicine | Admitting: Family Medicine

## 2014-11-11 DIAGNOSIS — M25551 Pain in right hip: Secondary | ICD-10-CM

## 2014-11-11 DIAGNOSIS — M8580 Other specified disorders of bone density and structure, unspecified site: Secondary | ICD-10-CM | POA: Diagnosis not present

## 2014-11-11 NOTE — Addendum Note (Signed)
Addended byVonita Moss: Cienna Dumais on: 11/11/2014 10:51 AM   Modules accepted: Orders

## 2014-11-13 ENCOUNTER — Telehealth: Payer: Self-pay | Admitting: Family Medicine

## 2014-11-13 NOTE — Telephone Encounter (Signed)
Pt's husband called to ask about results from pt's x rays. Please call ASAP. Thanks.

## 2014-11-14 ENCOUNTER — Ambulatory Visit (INDEPENDENT_AMBULATORY_CARE_PROVIDER_SITE_OTHER): Payer: Medicare Other | Admitting: Cardiovascular Disease

## 2014-11-14 ENCOUNTER — Encounter: Payer: Self-pay | Admitting: Cardiovascular Disease

## 2014-11-14 VITALS — BP 120/62 | HR 66 | Ht 64.0 in | Wt 170.0 lb

## 2014-11-14 DIAGNOSIS — E230 Hypopituitarism: Secondary | ICD-10-CM | POA: Diagnosis not present

## 2014-11-14 DIAGNOSIS — E785 Hyperlipidemia, unspecified: Secondary | ICD-10-CM

## 2014-11-14 DIAGNOSIS — I9589 Other hypotension: Secondary | ICD-10-CM

## 2014-11-14 DIAGNOSIS — I4891 Unspecified atrial fibrillation: Secondary | ICD-10-CM

## 2014-11-14 DIAGNOSIS — N390 Urinary tract infection, site not specified: Secondary | ICD-10-CM

## 2014-11-14 MED ORDER — APIXABAN 5 MG PO TABS: 5.0000 mg | ORAL_TABLET | Freq: Two times a day (BID) | ORAL | Status: DC

## 2014-11-14 NOTE — Progress Notes (Signed)
Patient ID: Natalie Rollins, female    DOB: 11-19-1929, 81 y.o.   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 Patient is relatively nonverbal  In follow-up today, he reports that she had a urinary tract infection, decreased responsiveness, found to be in atrial fibrillation with RVR. Treated with amiodarone load, converting to normal sinus rhythm. Warfarin was held.  Started on Eliquis for stroke prevention.   In follow-up today, he reports that she is doing well. Denies that she is having any symptoms of shortness of breath.  EKG on today's visit shows normal sinus rhythm with rate 66 bpm, diffuse T-wave abnormality   No Known Allergies  Current Outpatient Prescriptions on File Prior to Visit  Medication Sig Dispense Refill  . amoxicillin (AMOXIL) 500 MG capsule Take 1 capsule (500 mg total) by mouth 2 (two) times daily. 14 capsule 0  . Ascorbic Acid (VITAMIN C) 1000 MG tablet Take 1,000 mg by mouth daily.     Marland Kitchen. atorvastatin (LIPITOR) 10 MG tablet Take 1 tablet by mouth daily.    . Calcium-Magnesium-Zinc 333-133-5 MG TABS Take by mouth daily.     . Cholecalciferol (VITAMIN D) 2000 UNITS tablet Take 2,000 Units by mouth daily.    Marland Kitchen. desmopressin (DDAVP) 0.1 MG tablet TAKE 1 TABLET BY MOUTH TWICE A DAY 180 tablet 1  . diltiazem (CARDIZEM CD) 120  MG 24 hr capsule Take 120 mg by mouth daily.  1  . glucosamine-chondroitin 500-400 MG tablet Take 1 tablet by mouth 2 (two) times daily.    Marland Kitchen. HYDROcodone-acetaminophen (NORCO/VICODIN) 5-325 MG tablet Take 1 tablet by mouth every 6 (six) hours as needed for moderate pain. 30 tablet 0  . hydrocortisone (CORTEF) 10 MG tablet Take 40 mg by mouth daily.   1  . levothyroxine (SYNTHROID, LEVOTHROID) 88 MCG tablet Take 88 mcg by mouth daily before breakfast.    . Misc Natural Products (GLUCOSAMINE CHONDROITIN ADV) TABS Take by mouth 2 (two) times daily.     . Multiple Vitamin (MULTIVITAMIN) tablet Take 1 tablet by mouth daily.    . nitrofurantoin, macrocrystal-monohydrate, (MACROBID) 100 MG capsule Take 1 capsule (100 mg total) by mouth 2 (two) times daily. 14 capsule 0  . oxyCODONE-acetaminophen (PERCOCET/ROXICET) 5-325 MG per tablet Take 1 tablet by mouth daily as needed for severe pain.    . potassium chloride (K-DUR,KLOR-CON) 10 MEQ tablet Take 10 mEq by mouth daily.  0  . sodium bicarbonate 650 MG tablet Take 650 mg by mouth 2 (two) times daily.  1  . vitamin E 400 UNIT capsule Take 400 Units by mouth daily.      No current facility-administered medications on file prior to visit.    Past Medical History  Diagnosis Date  . PAF (paroxysmal atrial fibrillation) (HCC)     a. unknown if PAF vs persistent at this time given her travel history; b. on ; c. history of post  op a-fib 07/2010  . Pituitary mass (HCC)     a. 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 History of stroke x 2    a. left internal capsule CVA 07/2010; b. right stroke in right basal ganglia 2013  . HLD (hyperlipidemia)   . Hypothyroidism   . Arthritis     a. s/p lumbar fusion c/b broken hardware w/ removal of the fusion & subsequent revision most recently 07/2010  . Recurrent UTI   . Hydrocephalus     has a shunt placed   . Stroke (HCC)     x 2  . Dementia   . Allergy     Past  Surgical History  Procedure Laterality Date  . Spine surgery      metal rods places   . Patella fracture surgery      left   . Tibia fracture surgery      right  . Humerus surgery      right    Social History  reports that she has never smoked. She has never used smokeless tobacco. She reports that she drinks alcohol. She reports that she does not use illicit drugs.  Family History family history includes Diabetes in her son; Kidney Stones in her son.  Review of Systems  Constitutional: Negative.   Respiratory: Negative.   Cardiovascular: Negative.   Gastrointestinal: Negative.   Musculoskeletal: Negative.   Neurological: Negative.   Hematological: Negative.   Psychiatric/Behavioral: Negative.   All other systems reviewed and are negative.   BP 120/62 mmHg  Pulse 66  Ht  (1.626 m)  Wt 170 lb (77.111 kg)  BMI 29.17 kg/m2   Physical Exam  Constitutional: She appears well-developed and well-nourished.  HENT:  Head: Normocephalic.  Nose: Nose normal.  Mouth/Throat: Oropharynx is clear and moist.  Eyes: Conjunctivae are normal. Pupils are equal, round, and reactive to light.  Neck: Normal range of motion. Neck supple. No JVD present.  Cardiovascular: Normal rate, regular rhythm, normal heart sounds and intact distal pulses.  Exam reveals no gallop and no friction rub.   No murmur heard. Pulmonary/Chest: Effort normal and breath sounds normal. No respiratory distress. She has no wheezes. She has no rales. She exhibits no tenderness.  Abdominal: Soft. Bowel sounds are normal. She exhibits no distension. There is no tenderness.  Musculoskeletal: Normal range of motion. She exhibits no edema or tenderness.  Lymphadenopathy:    She has no cervical adenopathy.  Neurological: She is alert. Coordination normal.  Unable to assess  Skin: Skin is warm and dry. No rash noted. No erythema.  Psychiatric:  Unable to assess

## 2014-11-14 NOTE — Assessment & Plan Note (Signed)
Patient's husband discontinued the amiodarone We have recommended that he closely monitor her pulse rate and call our office if she runs fast Recent episode of atrial fibrillation likely in the setting of urinary tract infection, sepsis She is on anticoagulation

## 2014-11-14 NOTE — Assessment & Plan Note (Signed)
She currently takes desmopressin twice a day

## 2014-11-14 NOTE — Patient Instructions (Addendum)
You are doing well. No medication changes were made.  Please call us if you have new issues that need to be addressed before your next appt.  Your physician wants you to follow-up in: 6 months.  You will receive a reminder letter in the mail two months in advance. If you don't receive a letter, please call our office to schedule the follow-up appointment.  Atrial Fibrillation Atrial fibrillation is a type of irregular or rapid heartbeat (arrhythmia). In atrial fibrillation, the heart quivers continuously in a chaotic pattern. This occurs when parts of the heart receive disorganized signals that make the heart unable to pump blood normally. This can increase the risk for stroke, heart failure, and other heart-related conditions. There are different types of atrial fibrillation, including:  Paroxysmal atrial fibrillation. This type starts suddenly, and it usually stops on its own shortly after it starts.  Persistent atrial fibrillation. This type often lasts longer than a week. It may stop on its own or with treatment.  Long-lasting persistent atrial fibrillation. This type lasts longer than 12 months.  Permanent atrial fibrillation. This type does not go away. Talk with your health care provider to learn about the type of atrial fibrillation that you have. CAUSES This condition is caused by some heart-related conditions or procedures, including:  A heart attack.  Coronary artery disease.  Heart failure.  Heart valve conditions.  High blood pressure.  Inflammation of the sac that surrounds the heart (pericarditis).  Heart surgery.  Certain heart rhythm disorders, such as Wolf-Parkinson-White syndrome. Other causes include:  Pneumonia.  Obstructive sleep apnea.  Blockage of an artery in the lungs (pulmonary embolism, or PE).  Lung cancer.  Chronic lung disease.  Thyroid problems, especially if the thyroid is overactive (hyperthyroidism).  Caffeine.  Excessive alcohol  use or illegal drug use.  Use of some medicines, including certain decongestants and diet pills. Sometimes, the cause cannot be found. RISK FACTORS This condition is more likely to develop in:  People who are older in age.  People who smoke.  People who have diabetes mellitus.  People who are overweight (obese).  Athletes who exercise vigorously. SYMPTOMS Symptoms of this condition include:  A feeling that your heart is beating rapidly or irregularly.  A feeling of discomfort or pain in your chest.  Shortness of breath.  Sudden light-headedness or weakness.  Getting tired easily during exercise. In some cases, there are no symptoms. DIAGNOSIS Your health care provider may be able to detect atrial fibrillation when taking your pulse. If detected, this condition may be diagnosed with:  An electrocardiogram (ECG).  A Holter monitor test that records your heartbeat patterns over a 24-hour period.  Transthoracic echocardiogram (TTE) to evaluate how blood flows through your heart.  Transesophageal echocardiogram (TEE) to view more detailed images of your heart.  A stress test.  Imaging tests, such as a CT scan or chest X-ray.  Blood tests. TREATMENT The main goals of treatment are to prevent blood clots from forming and to keep your heart beating at a normal rate and rhythm. The type of treatment that you receive depends on many factors, such as your underlying medical conditions and how you feel when you are experiencing atrial fibrillation. This condition may be treated with:  Medicine to slow down the heart rate, bring the heart's rhythm back to normal, or prevent clots from forming.  Electrical cardioversion. This is a procedure that resets your heart's rhythm by delivering a controlled, low-energy shock to the heart through  your skin.  Different types of ablation, such as catheter ablation, catheter ablation with pacemaker, or surgical ablation. These procedures  destroy the heart tissues that send abnormal signals. When the pacemaker is used, it is placed under your skin to help your heart beat in a regular rhythm. HOME CARE INSTRUCTIONS  Take over-the counter and prescription medicines only as told by your health care provider.  If your health care provider prescribed a blood-thinning medicine (anticoagulant), take it exactly as told. Taking too much blood-thinning medicine can cause bleeding. If you do not take enough blood-thinning medicine, you will not have the protection that you need against stroke and other problems.  Do not use tobacco products, including cigarettes, chewing tobacco, and e-cigarettes. If you need help quitting, ask your health care provider.  If you have obstructive sleep apnea, manage your condition as told by your health care provider.  Do not drink alcohol.  Do not drink beverages that contain caffeine, such as coffee, soda, and tea.  Maintain a healthy weight. Do not use diet pills unless your health care provider approves. Diet pills may make heart problems worse.  Follow diet instructions as told by your health care provider.  Exercise regularly as told by your health care provider.  Keep all follow-up visits as told by your health care provider. This is important. PREVENTION  Avoid drinking beverages that contain caffeine or alcohol.  Avoid certain medicines, especially medicines that are used for breathing problems.  Avoid certain herbs and herbal medicines, such as those that contain ephedra or ginseng.  Do not use illegal drugs, such as cocaine and amphetamines.  Do not smoke.  Manage your high blood pressure. SEEK MEDICAL CARE IF:  You notice a change in the rate, rhythm, or strength of your heartbeat.  You are taking an anticoagulant and you notice increased bruising.  You tire more easily when you exercise or exert yourself. SEEK IMMEDIATE MEDICAL CARE IF:  You have chest pain, abdominal  pain, sweating, or weakness.  You feel nauseous.  You notice blood in your vomit, bowel movement, or urine.  You have shortness of breath.  You suddenly have swollen feet and ankles.  You feel dizzy.  You have sudden weakness or numbness of the face, arm, or leg, especially on one side of the body.  You have trouble speaking, trouble understanding, or both (aphasia).  Your face or your eyelid droops on one side. These symptoms may represent a serious problem that is an emergency. Do not wait to see if the symptoms will go away. Get medical help right away. Call your local emergency services (911 in the [place] Do not drive yourself to the hospital.   This information is not intended to replace advice given to you by your health care provider. Make sure you discuss any questions you have with your health care provider.   Document Released: 01/03/2005 Document Revised: 09/24/2014 Document Reviewed: 04/30/2014 Elsevier Interactive Patient Education [employer]2016 [employer]

## 2014-11-14 NOTE — Assessment & Plan Note (Signed)
Prior history of urinary tract infections. Most recent infection likely contributing to atrial fibrillation

## 2014-11-14 NOTE — Assessment & Plan Note (Signed)
Blood pressure appears stable on today's visit. Recent hypotension in the setting of urinary tract infection requiring fluid resuscitation

## 2014-11-14 NOTE — Assessment & Plan Note (Signed)
Currently on Lipitor 10 mg daily. 

## 2014-11-17 ENCOUNTER — Other Ambulatory Visit: Payer: Self-pay | Admitting: Family Medicine

## 2014-11-17 DIAGNOSIS — M25551 Pain in right hip: Secondary | ICD-10-CM

## 2014-11-17 NOTE — Progress Notes (Signed)
Phone call Reviewed x-ray is negative Discussed with patient's husband is patient unable to communicate but she is still having a great deal of pain in her right hip area Has been requesting refill of narcotics I reiterated the danger of taking narcotics in this age group that I would not refill these medications for patient to use Tylenol currently We will make an appointment with orthopedics to further evaluate for hip pain

## 2014-11-19 ENCOUNTER — Other Ambulatory Visit: Payer: Self-pay | Admitting: Unknown Physician Specialty

## 2014-12-21 ENCOUNTER — Ambulatory Visit: Payer: Medicare Other

## 2014-12-21 ENCOUNTER — Ambulatory Visit
Admission: EM | Admit: 2014-12-21 | Discharge: 2014-12-21 | Disposition: A | Discharge status: Home / Self Care | Payer: Medicare Other | Attending: Internal Medicine | Admitting: Internal Medicine

## 2014-12-21 ENCOUNTER — Encounter: Payer: Self-pay | Admitting: Emergency Medicine

## 2014-12-21 DIAGNOSIS — S9031XA Contusion of right foot, initial encounter: Secondary | ICD-10-CM | POA: Insufficient documentation

## 2014-12-21 DIAGNOSIS — S92901A Unspecified fracture of right foot, initial encounter for closed fracture: Secondary | ICD-10-CM | POA: Diagnosis not present

## 2014-12-21 DIAGNOSIS — S92911A Unspecified fracture of right toe(s), initial encounter for closed fracture: Secondary | ICD-10-CM | POA: Diagnosis not present

## 2014-12-21 DIAGNOSIS — M25571 Pain in right ankle and joints of right foot: Secondary | ICD-10-CM | POA: Insufficient documentation

## 2014-12-21 DIAGNOSIS — X58XXXA Exposure to other specified factors, initial encounter: Secondary | ICD-10-CM | POA: Diagnosis not present

## 2014-12-21 NOTE — ED Provider Notes (Signed)
CSN: [id number]     Arrival date & time 12/21/14  1057 History   First MD Initiated Contact with Patient 12/21/14 1315     Chief Complaint  Patient presents with  . Foot Pain   HPI  Patient is an 81 year old lady who caught her foot on something last evening, and stumbled, twisting her foot. Foot is quite swollen and bruised in 2 places, with refusal to bear weight. No other injuries reported.  Past Medical History  Diagnosis Date  . PAF (paroxysmal atrial fibrillation) (HCC)     a. unknown if PAF vs persistent at this time given her travel history; b. on ; c. history of post op a-fib 07/2010  . Pituitary mass (HCC)     a. 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 History of stroke x 2    a. left internal capsule CVA 07/2010; b. right stroke in right basal ganglia 2013  . HLD (hyperlipidemia)   . Hypothyroidism   . Arthritis     a. s/p lumbar fusion c/b broken hardware w/ removal of the fusion & subsequent revision most recently 07/2010  . Recurrent UTI   . Hydrocephalus     has a shunt placed   . Stroke (HCC)     x 2  . Dementia   . Allergy    Past Surgical History  Procedure Laterality Date  . Spine surgery      metal rods places   . Patella fracture surgery      left   . Tibia fracture surgery      right  . Humerus surgery      right   Family History  Problem Relation Age of Onset  . Diabetes Son   . Kidney Stones Son    Social History  Substance Use Topics  . Smoking status: Never Smoker   . Smokeless tobacco: Never Used  . Alcohol Use: Yes     Comment: a glass of wine on occasion    Review of Systems  All other systems reviewed and are negative.   Allergies  Review of patient's allergies indicates no known allergies.  Home Medications   Prior to Admission medications   Medication Sig Start Date End Date Taking? Authorizing Provider  amoxicillin (AMOXIL) 500 MG capsule Take 1 capsule (500 mg total) by  mouth 2 (two) times daily. 11/10/14   Steele Sizer, MD  apixaban (ELIQUIS) 5 MG TABS tablet Take 1 tablet (5 mg total) by mouth 2 (two) times daily. 11/14/14   Antonieta Iba, MD  Ascorbic Acid (VITAMIN C) 1000 MG tablet Take 1,000 mg by mouth daily.     Historical Provider, MD  atorvastatin (LIPITOR) 10 MG tablet Take 1 tablet by mouth daily. 08/13/14   Historical Provider, MD  Calcium-Magnesium-Zinc [phone] MG TABS Take by mouth daily.     Historical Provider, MD  Cholecalciferol (VITAMIN D) 2000 UNITS tablet Take 2,000 Units by mouth daily.    Historical Provider, MD  desmopressin (DDAVP) 0.1 MG tablet TAKE 1 TABLET BY MOUTH TWICE A DAY 10/27/14   Gabriel Cirri, NP  diltiazem (CARDIZEM CD) 120 MG 24 hr capsule TAKE ONE CAPSULE BY MOUTH DAILY 11/19/14   Gabriel Cirri, NP  glucosamine-chondroitin 500-400 MG tablet Take 1 tablet by mouth 2 (two) times daily.    Historical Provider, MD  HYDROcodone-acetaminophen (NORCO/VICODIN) 5-325 MG tablet Take 1 tablet by mouth every 6 (six) hours as needed for moderate pain.  10/29/14   Gabriel Cirri, NP  hydrocortisone (CORTEF) 10 MG tablet Take 40 mg by mouth daily.  05/22/14   Historical Provider, MD  levothyroxine (SYNTHROID, LEVOTHROID) 88 MCG tablet Take 88 mcg by mouth daily before breakfast.    Historical Provider, MD  Misc Natural Products (GLUCOSAMINE CHONDROITIN ADV) TABS Take by mouth 2 (two) times daily.     Historical Provider, MD  Multiple Vitamin (MULTIVITAMIN) tablet Take 1 tablet by mouth daily.    Historical Provider, MD  nitrofurantoin, macrocrystal-monohydrate, (MACROBID) 100 MG capsule Take 1 capsule (100 mg total) by mouth 2 (two) times daily. 09/01/14   Payton Mccallum, MD  oxyCODONE-acetaminophen (PERCOCET/ROXICET) 5-325 MG per tablet Take 1 tablet by mouth daily as needed for severe pain.    Historical Provider, MD  potassium chloride (K-DUR,KLOR-CON) 10 MEQ tablet Take 10 mEq by mouth daily. 04/21/14   Historical Provider, MD  sodium  bicarbonate 650 MG tablet Take 650 mg by mouth 2 (two) times daily. 05/30/14   Historical Provider, MD  vitamin E 400 UNIT capsule Take 400 Units by mouth daily.     Historical Provider, MD   Meds Ordered and Administered this Visit  Medications - No data to display  BP 148/80 mmHg  Pulse 68  Temp(Src) 97.7 F (36.5 C) (Tympanic)  Resp 20  Ht  (1.575 m)  Wt 168 lb (76.204 kg)  BMI 30.72 kg/m2  SpO2 100% No data found.   Physical Exam  Constitutional: She is oriented to person, place, and time. No distress.  Alert, nicely groomed Sitting in wheelchair  HENT:  Head: Atraumatic.  Eyes:  Conjugate gaze, no eye redness/drainage  Neck:  Turns head freely  Cardiovascular: Normal rate.   Pulmonary/Chest: No respiratory distress.  Abdominal: She exhibits no distension.  Musculoskeletal:  No leg swelling Right dorsal foot and ankle laterally are swollen, with focal bruising over the proximal fourth and fifth toes, and over the lateral midfoot. There appears to be tenderness most pronounced over the proximal fourth and fifth toes. Mild foot drop is present Patient had just started to be able to walk again. Previous ankle fixation was followed by Dr. Rosita Kea  Neurological: She is alert and oriented to person, place, and time.  Skin: Skin is warm and dry.  No cyanosis  Nursing note and vitals reviewed.   ED Course  Procedures (including critical care time)   Imaging Review Dg Ankle Complete Right  12/21/2014  CLINICAL DATA:  Right foot/ankle pain and bruising following injury yesterday. Initial encounter. EXAM: RIGHT ANKLE - COMPLETE 3+ VIEW COMPARISON:  Radiographs 09/08/2008. FINDINGS: The bones are demineralized. Medial tibial plate and screws are unchanged without loosening. There is mild spurring of the malleoli. No acute osseous findings are demonstrated at the ankle. There is mild posttraumatic deformity of the talar dome medially. On the lateral view, there is fragmented  spurring versus an avulsion fracture from the dorsal aspect of the navicular. IMPRESSION: 1. No acute findings demonstrated at the ankle status post distal tibial ORIF. 2. New irregularity of the dorsal aspect of the navicular may reflect an avulsion fracture or fragmented spurring. Correlate with the area of pain. Electronically Signed   By: Carey Bullocks M.D.   On: 12/21/2014 13:22   Dg Foot Complete Right  12/21/2014  CLINICAL DATA:  Right foot/ankle pain and bruising following injury yesterday. Initial encounter. EXAM: RIGHT FOOT COMPLETE - 3+ VIEW COMPARISON:  09/08/2008 ankle radiographs. FINDINGS: The bones are diffusely demineralized. There are postsurgical  changes in the distal tibia. There is deformity of the base of the fourth proximal phalanx which could be secondary to an old fracture. If the patient has pain in this area, this could reflect an acute fracture. No evidence of dislocation. Mild midfoot degenerative changes are noted. IMPRESSION: Age-indeterminate fracture involving the base of the fourth proximal phalanx. Correlate with area of clinical concern/pain. No other acute findings. Electronically Signed   By: Carey BullocksWilliam  Veazey M.D.   On: 12/21/2014 13:19     MDM   1. Toe fracture, right, closed, initial encounter   2. Foot fracture, right, closed, initial encounter    Posterior OCL splint applied per nursing. Patient should be nonweightbearing until follow-up with orthopedics in 10-14 days.    Eustace MooreLaura W Yoseline Andersson, MD 12/21/14 1351

## 2014-12-21 NOTE — ED Notes (Signed)
Per Dr. Dayton ScrapeMurray, applied posterior leg splint, 14 inches ortho glass, white underwrap, and 4" ace wrap.

## 2014-12-21 NOTE — ED Notes (Signed)
Pt twisted R foot when walking last night, cannot bear weight, tender and swollen.

## 2014-12-21 NOTE — Discharge Instructions (Signed)
Wear splint and avoid weight bearing until cleared to remove splint/bear weight by orthopedist.

## 2014-12-21 NOTE — ED Notes (Signed)
Pt also has old skin tear on R arm that hospital put tegaderm on. Tegaderm now coming off, may need rebandage with other material.

## 2015-02-23 ENCOUNTER — Telehealth: Payer: Self-pay | Admitting: Family Medicine

## 2015-02-23 ENCOUNTER — Ambulatory Visit
Admission: RE | Admit: 2015-02-23 | Discharge: 2015-02-23 | Disposition: A | Discharge status: Home / Self Care | Payer: Medicare Other | Source: Ambulatory Visit | Attending: Unknown Physician Specialty | Admitting: Unknown Physician Specialty

## 2015-02-23 ENCOUNTER — Telehealth: Payer: Self-pay | Admitting: Unknown Physician Specialty

## 2015-02-23 DIAGNOSIS — M546 Pain in thoracic spine: Secondary | ICD-10-CM | POA: Insufficient documentation

## 2015-02-23 DIAGNOSIS — R918 Other nonspecific abnormal finding of lung field: Secondary | ICD-10-CM | POA: Insufficient documentation

## 2015-02-23 DIAGNOSIS — J90 Pleural effusion, not elsewhere classified: Secondary | ICD-10-CM | POA: Diagnosis not present

## 2015-02-23 DIAGNOSIS — M8580 Other specified disorders of bone density and structure, unspecified site: Secondary | ICD-10-CM | POA: Diagnosis not present

## 2015-02-23 DIAGNOSIS — R9389 Abnormal findings on diagnostic imaging of other specified body structures: Secondary | ICD-10-CM

## 2015-02-23 MED ORDER — DOXYCYCLINE HYCLATE 100 MG PO TABS: 100.0000 mg | ORAL_TABLET | Freq: Two times a day (BID) | ORAL | Status: DC

## 2015-02-23 NOTE — Telephone Encounter (Signed)
Discussed results of x-rays. Thoracic spine without acute issues.  ? Pneumonia on chest x-ray.  Will rx Doxycycline and needs f/u x-ray in 3-4 weeks.

## 2015-02-23 NOTE — Telephone Encounter (Signed)
Called and let patient's husband know that the x-ray orders were placed. He asked where they could go and I gave him options like [facility], the imaging center, [place], or the [place] location. I asked for them to please give [place] a call if we could do anything else for them.

## 2015-02-23 NOTE — Telephone Encounter (Signed)
Called and spoke to Mr. Leyland. He states that the patient is complaining of pain on the right side just below her rib cage.

## 2015-02-23 NOTE — Telephone Encounter (Signed)
Mr Ficken called and and stated that the pt had a fall over the weekend and would like to have an order for a xray.

## 2015-02-23 NOTE — Telephone Encounter (Signed)
Please ask what part of her body is hurting

## 2015-02-23 NOTE — Telephone Encounter (Signed)
Patient's husband called back and said that it is the left side under the rib cage where pt's pain is. Not the right side.

## 2015-02-23 NOTE — Telephone Encounter (Signed)
Orders are in

## 2015-03-14 ENCOUNTER — Emergency Department: Payer: Medicare Other

## 2015-03-14 ENCOUNTER — Emergency Department
Admission: EM | Admit: 2015-03-14 | Discharge: 2015-03-14 | Disposition: A | Discharge status: Other Acute Inpatient Hospital | Payer: Medicare Other | Attending: Emergency Medicine | Admitting: Emergency Medicine

## 2015-03-14 ENCOUNTER — Encounter: Payer: Self-pay | Admitting: Emergency Medicine

## 2015-03-14 DIAGNOSIS — Z982 Presence of cerebrospinal fluid drainage device: Secondary | ICD-10-CM | POA: Diagnosis not present

## 2015-03-14 DIAGNOSIS — N390 Urinary tract infection, site not specified: Secondary | ICD-10-CM | POA: Diagnosis not present

## 2015-03-14 DIAGNOSIS — I6201 Nontraumatic acute subdural hemorrhage: Secondary | ICD-10-CM

## 2015-03-14 DIAGNOSIS — R55 Syncope and collapse: Secondary | ICD-10-CM | POA: Insufficient documentation

## 2015-03-14 DIAGNOSIS — Z792 Long term (current) use of antibiotics: Secondary | ICD-10-CM | POA: Insufficient documentation

## 2015-03-14 DIAGNOSIS — G309 Alzheimer's disease, unspecified: Secondary | ICD-10-CM | POA: Diagnosis not present

## 2015-03-14 DIAGNOSIS — Z7952 Long term (current) use of systemic steroids: Secondary | ICD-10-CM | POA: Diagnosis not present

## 2015-03-14 DIAGNOSIS — Z7901 Long term (current) use of anticoagulants: Secondary | ICD-10-CM | POA: Insufficient documentation

## 2015-03-14 DIAGNOSIS — F0280 Dementia in other diseases classified elsewhere without behavioral disturbance: Secondary | ICD-10-CM | POA: Insufficient documentation

## 2015-03-14 DIAGNOSIS — I6203 Nontraumatic chronic subdural hemorrhage: Secondary | ICD-10-CM | POA: Insufficient documentation

## 2015-03-14 DIAGNOSIS — Z79899 Other long term (current) drug therapy: Secondary | ICD-10-CM | POA: Diagnosis not present

## 2015-03-14 LAB — CBC WITH DIFFERENTIAL/PLATELET
BASOS ABS: 0.1 10*3/uL (ref 0–0.1)
BASOS PCT: 1 %
Eosinophils Absolute: 0.2 10*3/uL (ref 0–0.7)
Eosinophils Relative: 3 %
HEMATOCRIT: 41.6 % (ref 35.0–47.0)
HEMOGLOBIN: 13.8 g/dL (ref 12.0–16.0)
Lymphocytes Relative: 39 %
Lymphs Abs: 3.5 10*3/uL (ref 1.0–3.6)
MCH: 29.5 pg (ref 26.0–34.0)
MCHC: 33 g/dL (ref 32.0–36.0)
MCV: 89.3 fL (ref 80.0–100.0)
MONO ABS: 1 10*3/uL — AB (ref 0.2–0.9)
Monocytes Relative: 11 %
NEUTROS ABS: 4.1 10*3/uL (ref 1.4–6.5)
Neutrophils Relative %: 46 %
Platelets: 214 10*3/uL (ref 150–440)
RBC: 4.66 MIL/uL (ref 3.80–5.20)
RDW: 16.4 % — AB (ref 11.5–14.5)
WBC: 9 10*3/uL (ref 3.6–11.0)

## 2015-03-14 LAB — URINALYSIS COMPLETE WITH MICROSCOPIC (ARMC ONLY)
BILIRUBIN URINE: NEGATIVE
GLUCOSE, UA: NEGATIVE mg/dL
NITRITE: NEGATIVE
Protein, ur: 30 mg/dL — AB
Specific Gravity, Urine: 1.009 (ref 1.005–1.030)
pH: 8 (ref 5.0–8.0)

## 2015-03-14 LAB — COMPREHENSIVE METABOLIC PANEL
ALBUMIN: 3.2 g/dL — AB (ref 3.5–5.0)
ALK PHOS: 229 U/L — AB (ref 38–126)
ALT: 64 U/L — AB (ref 14–54)
AST: 93 U/L — ABNORMAL HIGH (ref 15–41)
Anion gap: 9 (ref 5–15)
BILIRUBIN TOTAL: 1.1 mg/dL (ref 0.3–1.2)
BUN: 6 mg/dL (ref 6–20)
CALCIUM: 8.3 mg/dL — AB (ref 8.9–10.3)
CO2: 23 mmol/L (ref 22–32)
CREATININE: 1.02 mg/dL — AB (ref 0.44–1.00)
Chloride: 112 mmol/L — ABNORMAL HIGH (ref 101–111)
GFR calc Af Amer: 57 mL/min — ABNORMAL LOW (ref >60)
GFR calc non Af Amer: 49 mL/min — ABNORMAL LOW (ref >60)
GLUCOSE: 134 mg/dL — AB (ref 65–99)
Potassium: 3.3 mmol/L — ABNORMAL LOW (ref 3.5–5.1)
Sodium: 144 mmol/L (ref 135–145)
Total Protein: 5.8 g/dL — ABNORMAL LOW (ref 6.5–8.1)

## 2015-03-14 LAB — LACTIC ACID, PLASMA: Lactic Acid, Venous: 2.4 mmol/L (ref 0.5–2.0)

## 2015-03-14 LAB — GLUCOSE, CAPILLARY: Glucose-Capillary: 119 mg/dL — ABNORMAL HIGH (ref 65–99)

## 2015-03-14 LAB — LIPASE, BLOOD: Lipase: 14 U/L (ref 11–51)

## 2015-03-14 LAB — ABO/RH: ABO/RH(D): O NEG

## 2015-03-14 LAB — TROPONIN I: Troponin I: <0.03 ng/mL (ref <0.031)

## 2015-03-14 MED ORDER — SODIUM CHLORIDE 0.9 % IV BOLUS (SEPSIS)
1000.0000 mL | Freq: Once | INTRAVENOUS | Status: AC
  Administered 2015-03-14: 1000 mL via INTRAVENOUS

## 2015-03-14 MED ORDER — SODIUM CHLORIDE 0.9 % IV SOLN: 10.0000 mL/h | Freq: Once | INTRAVENOUS | Status: DC

## 2015-03-14 MED ORDER — SODIUM CHLORIDE 0.9 % IV SOLN
500.0000 mg | Freq: Once | INTRAVENOUS | Status: AC
  Administered 2015-03-14: 500 mg via INTRAVENOUS
  Filled 2015-03-14: qty 5

## 2015-03-14 MED ORDER — DEXTROSE 5 % IV SOLN
1.0000 g | Freq: Once | INTRAVENOUS | Status: AC
  Administered 2015-03-14: 1 g via INTRAVENOUS
  Filled 2015-03-14: qty 10

## 2015-03-14 NOTE — ED Provider Notes (Signed)
[facility] Emergency Department Provider Note   ____________________________________________  Time seen: Approximately 9:20 AM I have reviewed the triage vital signs and the triage nursing note.  HISTORY  Chief Complaint Loss of Consciousness   Historian Patient is a poor historian, altered mental status History per spouse  HPI Natalie Rollins is a 82 y.o. female with a history of meningioma status post VP shunt and history of CVA, is here for evaluation because of syncopal episode and altered mental status. Husband states patient didn't want to eat much yesterday and was complaining a little bit of abdominal pain. This morning he got up to the bathroom and she pooped in her pants. When he went to get things to change her he found her on the floor suspicious of her likely passing out, versus generalized weakness taking her to the floor. She does have underlying dementia, but seems to be less alert than baseline. Denies recent fevers, diarrhea, coughing, or trauma.    Past Medical History  Diagnosis Date  . PAF (paroxysmal atrial fibrillation) (HCC)     a. unknown if PAF vs persistent at this time given her travel history; b. on ; c. history of post op a-fib 07/2010  . Pituitary mass (HCC)     a. 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 History of stroke x 2    a. left internal capsule CVA 07/2010; b. right stroke in right basal ganglia 2013  . HLD (hyperlipidemia)   . Hypothyroidism   . Arthritis     a. s/p lumbar fusion c/b broken hardware w/ removal of the fusion & subsequent revision most recently 07/2010  . Recurrent UTI   . Hydrocephalus     has a shunt placed   . Stroke (HCC)     x 2  . Dementia   . Allergy     Patient Active Problem List   Diagnosis Date Noted  . Right hip pain 11/10/2014  . Chronic pain 09/15/2014  . Allergic rhinitis 09/15/2014  . Atrial fibrillation with RVR (HCC)  09/06/2014  . HLD (hyperlipidemia) 09/06/2014  . Hypothyroidism (acquired) 09/06/2014  . UTI (lower urinary tract infection) 09/06/2014  . Panhypopituitarism (HCC) 09/06/2014  . Atrial fibrillation, unspecified 06/19/2014  . History of stroke 06/19/2014  . Nonverbal 06/19/2014  . Altered mental status 06/19/2014  . History of frequent urinary tract infections 06/19/2014  . AD (Alzheimer's disease) 02/25/2014  . Acquired hypothyroidism 02/25/2014  . 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 Hypotension, iatrogenic 02/25/2014    Past Surgical History  Procedure Laterality Date  . Spine surgery      metal rods places   . Patella fracture surgery      left   . Tibia fracture surgery      right  . Humerus surgery      right    Current Outpatient Rx  Name  Route  Sig  Dispense  Refill  . amoxicillin (AMOXIL) 500 MG capsule   Oral   Take 1 capsule (500 mg total) by mouth 2 (two) times daily.   14 capsule   0   . apixaban (ELIQUIS) 5 MG TABS tablet   Oral   Take 1 tablet (5 mg total) by mouth 2 (two) times daily.   60 tablet   11   . Ascorbic Acid (VITAMIN C) 1000 MG tablet   Oral   Take 1,000 mg by mouth daily.          Marland Kitchen  atorvastatin (LIPITOR) 10 MG tablet   Oral   Take 1 tablet by mouth daily.         . Calcium-Magnesium-Zinc 333-133-5 MG TABS   Oral   Take by mouth daily.          . Cholecalciferol (VITAMIN D) 2000 UNITS tablet   Oral   Take 2,000 Units by mouth daily.         Marland Kitchen desmopressin (DDAVP) 0.1 MG tablet      TAKE 1 TABLET BY MOUTH TWICE A DAY   180 tablet   1   . diltiazem (CARDIZEM CD) 120 MG 24 hr capsule      TAKE ONE CAPSULE BY MOUTH DAILY   90 capsule   1   . doxycycline (VIBRA-TABS) 100 MG tablet   Oral   Take 1 tablet (100 mg total) by mouth 2 (two) times daily.   20 tablet   0   . glucosamine-chondroitin 500-400 MG tablet   Oral   Take 1 tablet by mouth 2 (two) times daily.         Marland Kitchen  HYDROcodone-acetaminophen (NORCO/VICODIN) 5-325 MG tablet   Oral   Take 1 tablet by mouth every 6 (six) hours as needed for moderate pain.   30 tablet   0   . hydrocortisone (CORTEF) 10 MG tablet   Oral   Take 40 mg by mouth daily.       1   . levothyroxine (SYNTHROID, LEVOTHROID) 88 MCG tablet   Oral   Take 88 mcg by mouth daily before breakfast.         . Misc Natural Products (GLUCOSAMINE CHONDROITIN ADV) TABS   Oral   Take by mouth 2 (two) times daily.          . Multiple Vitamin (MULTIVITAMIN) tablet   Oral   Take 1 tablet by mouth daily.         . nitrofurantoin, macrocrystal-monohydrate, (MACROBID) 100 MG capsule   Oral   Take 1 capsule (100 mg total) by mouth 2 (two) times daily.   14 capsule   0   . oxyCODONE-acetaminophen (PERCOCET/ROXICET) 5-325 MG per tablet   Oral   Take 1 tablet by mouth daily as needed for severe pain.         . potassium chloride (K-DUR,KLOR-CON) 10 MEQ tablet   Oral   Take 10 mEq by mouth daily.      0   . sodium bicarbonate 650 MG tablet   Oral   Take 650 mg by mouth 2 (two) times daily.      1   . vitamin E 400 UNIT capsule   Oral   Take 400 Units by mouth daily.            Allergies Review of patient's allergies indicates no known allergies.  Family History  Problem Relation Age of Onset  . Diabetes Son   . Kidney Stones Son     Social History Social History  Substance Use Topics  . Smoking status: Never Smoker   . Smokeless tobacco: Never Used  . Alcohol Use: Yes     Comment: a glass of wine on occasion    Review of Systems  Constitutional: Negative for fever. Eyes: Negative for visual changes. ENT: Negative for sore throat. Cardiovascular: Negative for chest pain. Respiratory: Negative for shortness of breath. Gastrointestinal: One episode of emesis/spitting up this morning. Genitourinary: Negative for dysuria. History of UTIs. Musculoskeletal: Negative for back pain. Skin: Negative  for  rash. Neurological: Negative for headache. 10 point Review of Systems otherwise negative ____________________________________________   PHYSICAL EXAM:  VITAL SIGNS: ED Triage Vitals  Enc Vitals Group     BP 03/14/15 0916 111/82 mmHg     Pulse Rate 03/14/15 0916 100     Resp 03/14/15 0916 24     Temp 03/14/15 0916 98.5 F (36.9 C)     Temp Source 03/14/15 0916 Oral     SpO2 03/14/15 0916 99 %     Weight 03/14/15 0916 168 lb (76.204 kg)     Height 03/14/15 0916 5\' 5"  (1.651 m)     Head Cir --      Peak Flow --      Pain Score --      Pain Loc --      Pain Edu? --      Excl. in GC? --      Constitutional: Eyes open to voice, disoriented. In no distress. HEENT   Head: Normocephalic and atraumatic.      Eyes: Conjunctivae are normal. PERRL. Normal extraocular movements.      Ears:         Nose: No congestion/rhinnorhea.   Mouth/Throat: Mucous membranes arMildly dry .   Neck: No stridor. Cardiovascular/Chest: Normal rate, regular rhythm.  No murmurs, rubs, or gallops. Respiratory: Normal respiratory effort without tachypnea nor retractions. Breath sounds are clear and equal bilaterally. No wheezes/rales/rhonchi. Gastrointestinal: Soft. No distention, no guarding, no rebound. moderately tender diffusely. Obese.   Genitourinary/rectal:Deferred Musculoskeletal: Nontender with normal range of motion in all extremities. No joint effusions.  No lower extremity tenderness.  No edema. Neurologic: No slurred speech. Disoriented to place and time.  No facial droop. No gross or focal neurologic deficits are appreciated. Skin:  Skin is warm, dry and intact. No rash noted. Psychiatrino hallucinations.  ____________________________________________   EKG I, Governor Rooks, MD, the attending physician have personally viewed and interpreted all ECGs.  96 bpm. Normal sinus rhythm. Narrow QRS. Normal axis. Nonspecific ST and  T-wave ____________________________________________  LABS (pertinent positives/negatives)  Urinalysis 3+ leukocytes, too numerous to count white blood cells and rare bacteria Copy the metabolic panel significant for potassium 3.3, chloride 112, creatinine 1.02, glucose 134 AST 93, a LT 64, alkaline phosphatase 229, bili 1.1 Lipase 14 Troponin less than 0.03 Lactate 2.4 and white blood count 9.0, hemoglobin 13.8 and platelet count 214  ____________________________________________  RADIOLOGY All Xrays were viewed by me. Imaging interpreted by Radiologist.  dg abdomen with chest: No acute chest abnormality. No acute abdominal findings.  CT head noncontrast:  IMPRESSION: 1. Bilateral small subdural hematomas with acute and chronic components, right greater than left. The thickest area of subdural fluid is seen on the right on image 20 measuring up to 9 mm of largely chronic component. There is no midline shift. High attenuation adjacent to the VP shunt as it enters the skull is favored to be artifactual. A small amount of adjacent acute blood is considered less likely. Recommend attention on follow-up. 2. Chronic white matter changes and lacunar infarcts. No evidence of acute ischemia or infarct. 3. Increasing fluid in the mastoid air cells without bony erosion or overlying soft tissue thickening. Findings discussed with Dr. Shaune Pollack at the time of dictation. __________________________________________  PROCEDURES  Procedure(s) performed: None  Critical Care performed: CRITICAL CARE Performed by: Governor Rooks   Total critical care time: 30 minutes  Critical care time was exclusive of separately billable procedures and treating other patients.  Critical care was necessary to treat or prevent imminent or life-threatening deterioration.  Critical care was time spent personally by me on the following activities: development of treatment plan with patient and/or surrogate as well  as nursing, discussions with consultants, evaluation of patient's response to treatment, examination of patient, obtaining history from patient or surrogate, ordering and performing treatments and interventions, ordering and review of laboratory studies, ordering and review of radiographic studies, pulse oximetry and re-evaluation of patient's condition.   ____________________________________________   ED COURSE / ASSESSMENT AND PLAN  Pertinent labs & imaging results that were available during my care of the patient were reviewed by me and considered in my medical decision making (see chart for details).   Patient here with syncope today, and altered mental status on her normal dementia baseline for couple days. She is not febrile and has stable vital signs.  Head CT shows acute on chronic subdural hematomas and she is on Eloquis for cardiac reasons.  She is not on aspirin reportedly.  Urinalysis positive for urinary tract infection. Patient was given Rocephin. No evidence of sepsis as she has no fever, elevated white blood cell count.  Family, husband, requested transfer to [facility].  I discussed this case with the on-call neurosurgeon resident Dr. Allegra Grana.  Patient accepted by neurosurgeon on behalf of the emergency Department attending Dr.Fecci.  Neurosurgeon recommended FFP in the event that our hospital does not have [facility] for reversal.  Patient is currently stable, I ordered 2 units of FFP as this is the maximal in the blood bank will initially release and patient will be transferring.     CONSULTATIONS:   Neurosurgeon at [facility] for transfer acceptance.   Patient / Family / Caregiver informed of clinical course, medical decision-making process, and agree with plan.     ___________________________________________   FINAL CLINICAL IMPRESSION(S) / ED DIAGNOSES   Final diagnoses:  Syncope, unspecified syncope type  UTI (lower urinary tract infection)  Acute on chronic  intracranial subdural hematoma (HCC)              Note: This dictation was prepared with Dragon dictation. Any transcriptional errors that result from this process are unintentional   Governor Rooks, MD 03/14/15 1204

## 2015-03-14 NOTE — ED Notes (Signed)
BIB EMS from home found unresponsive in bathroom by husband. Hx of dementia. Baseline per husband. HR found to be in upper 200's converted in truck hypotensive 80's systolic. Given NS in route systolic increases to 100. CBG 160. Responds to touch.

## 2015-03-14 NOTE — ED Notes (Signed)
[facility] here to pick up patient. 2nd bag of FFP hung by this RN and Aundra Millet, Charity fundraiser. FFP set as transfusing transfer, sent with [facility], infusion handed off to Eli Phillips, RN from [facility].

## 2015-03-14 NOTE — ED Notes (Signed)
cbg 119 

## 2015-03-15 LAB — PREPARE FRESH FROZEN PLASMA
UNIT DIVISION: 0
Unit division: 0

## 2015-03-24 ENCOUNTER — Encounter: Payer: Self-pay | Admitting: Family Medicine

## 2015-03-24 ENCOUNTER — Ambulatory Visit (INDEPENDENT_AMBULATORY_CARE_PROVIDER_SITE_OTHER): Payer: Medicare Other | Admitting: Family Medicine

## 2015-03-24 VITALS — BP 138/78 | HR 84 | Temp 98.1°F | Ht 64.0 in | Wt 173.0 lb

## 2015-03-24 DIAGNOSIS — E785 Hyperlipidemia, unspecified: Secondary | ICD-10-CM

## 2015-03-24 DIAGNOSIS — D692 Other nonthrombocytopenic purpura: Secondary | ICD-10-CM

## 2015-03-24 NOTE — Progress Notes (Signed)
BP 138/78 mmHg  Pulse 84  Temp(Src) 98.1 F (36.7 C)  Ht 5\' 4"  (1.626 m)  Wt 173 lb (78.472 kg)  BMI 29.68 kg/m2  SpO2 99%   Subjective:    Patient ID: Natalie Rollins, female    DOB: Mar 07, 1929, 82 y.o.   MRN: [id number]  HPI: Natalie Rollins is a 82 y.o. female  Chief Complaint  Patient presents with  . Hospitalization Follow-up    [facility]/ sent to [facility]  - syncope   reviewed hospital notes from [facility] with arm fracture which patient is doing well with no complaints Head trauma no issues No change in medications except for urinary tract infection medicines patient with no further altered awareness episodes History provided primarily by husband and on chart review from [facility].   Relevant past medical, surgical, family and social history reviewed and updated as indicated. Interim medical history since our last visit reviewed. Allergies and medications reviewed and updated.  Review of Systems  Constitutional: Negative.   Respiratory: Negative.   Cardiovascular: Negative.     Per HPI unless specifically indicated above     Objective:    BP 138/78 mmHg  Pulse 84  Temp(Src) 98.1 F (36.7 C)  Ht 5\' 4"  (1.626 m)  Wt 173 lb (78.472 kg)  BMI 29.68 kg/m2  SpO2 99%  Wt Readings from Last 3 Encounters:  03/24/15 173 lb (78.472 kg)  03/14/15 168 lb (76.204 kg)  12/21/14 168 lb (76.204 kg)    Physical Exam  Constitutional: She is oriented to person, place, and time. She appears well-developed and well-nourished. No distress.  HENT:  Head: Normocephalic and atraumatic.  Right Ear: Hearing normal.  Left Ear: Hearing normal.  Nose: Nose normal.  Eyes: Conjunctivae and lids are normal. Right eye exhibits no discharge. Left eye exhibits no discharge. No scleral icterus.  Cardiovascular: Normal heart sounds.   Pulmonary/Chest: Effort normal and breath sounds normal. No respiratory distress.  Musculoskeletal: Normal range of motion.  Neurological: She is alert and  oriented to person, place, and time.  Skin: Skin is intact. No rash noted.  As with some bruising and senile purpura also leg with multiple bruising from fall  Psychiatric: Her speech is normal. Cognition and memory are normal.  stable    Results for orders placed or performed during the hospital encounter of 03/14/15  Glucose, capillary  Result Value Ref Range   Glucose-Capillary 119 (H) 65 - 99 mg/dL  Comprehensive metabolic panel  Result Value Ref Range   Sodium 144 135 - 145 mmol/L   Potassium 3.3 (L) 3.5 - 5.1 mmol/L   Chloride 112 (H) 101 - 111 mmol/L   CO2 23 22 - 32 mmol/L   Glucose, Bld 134 (H) 65 - 99 mg/dL   BUN 6 6 - 20 mg/dL   Creatinine, Ser 4.091.02 (H) 0.44 - 1.00 mg/dL   Calcium 8.3 (L) 8.9 - 10.3 mg/dL   Total Protein 5.8 (L) 6.5 - 8.1 g/dL   Albumin 3.2 (L) 3.5 - 5.0 g/dL   AST 93 (H) 15 - 41 U/L   ALT 64 (H) 14 - 54 U/L   Alkaline Phosphatase 229 (H) 38 - 126 U/L   Total Bilirubin 1.1 0.3 - 1.2 mg/dL   GFR calc non Af Amer 49 (L) >60 mL/min   GFR calc Af Amer 57 (L) >60 mL/min   Anion gap 9 5 - 15  Lipase, blood  Result Value Ref Range   Lipase 14 11 -  51 U/L  Troponin I  Result Value Ref Range   Troponin I <0.03 <0.031 ng/mL  Lactic acid, plasma  Result Value Ref Range   Lactic Acid, Venous 2.4 (HH) 0.5 - 2.0 mmol/L  CBC with Differential  Result Value Ref Range   WBC 9.0 3.6 - 11.0 K/uL   RBC 4.66 3.80 - 5.20 MIL/uL   Hemoglobin 13.8 12.0 - 16.0 g/dL   HCT 16.1 09.6 - 04.5 %   MCV 89.3 80.0 - 100.0 fL   MCH 29.5 26.0 - 34.0 pg   MCHC 33.0 32.0 - 36.0 g/dL   RDW 40.9 (H) 81.1 - 91.4 %   Platelets 214 150 - 440 K/uL   Neutrophils Relative % 46 %   Neutro Abs 4.1 1.4 - 6.5 K/uL   Lymphocytes Relative 39 %   Lymphs Abs 3.5 1.0 - 3.6 K/uL   Monocytes Relative 11 %   Monocytes Absolute 1.0 (H) 0.2 - 0.9 K/uL   Eosinophils Relative 3 %   Eosinophils Absolute 0.2 0 - 0.7 K/uL   Basophils Relative 1 %   Basophils Absolute 0.1 0 - 0.1 K/uL   Urinalysis complete, with microscopic  Result Value Ref Range   Color, Urine YELLOW (A) YELLOW   APPearance HAZY (A) CLEAR   Glucose, UA NEGATIVE NEGATIVE mg/dL   Bilirubin Urine NEGATIVE NEGATIVE   Ketones, ur TRACE (A) NEGATIVE mg/dL   Specific Gravity, Urine 1.009 1.005 - 1.030   Hgb urine dipstick 1+ (A) NEGATIVE   pH 8.0 5.0 - 8.0   Protein, ur 30 (A) NEGATIVE mg/dL   Nitrite NEGATIVE NEGATIVE   Leukocytes, UA 3+ (A) NEGATIVE   RBC / HPF 0-5 0 - 5 RBC/hpf   WBC, UA TOO NUMEROUS TO COUNT 0 - 5 WBC/hpf   Bacteria, UA RARE (A) NONE SEEN   Squamous Epithelial / LPF 0-5 (A) NONE SEEN   WBC Clumps PRESENT    Mucous PRESENT    Hyaline Casts, UA PRESENT    Amorphous Crystal PRESENT   Prepare fresh frozen plasma  Result Value Ref Range   Unit Number [id number]    Blood Component Type THAWED PLASMA    Unit division 00    Status of Unit ISSUED,FINAL    Transfusion Status OK TO TRANSFUSE    Unit Number [id number]    Blood Component Type THAWED PLASMA    Unit division 00    Status of Unit ISSUED,FINAL    Transfusion Status OK TO TRANSFUSE   ABO/Rh  Result Value Ref Range   ABO/RH(D) O NEG       Assessment & Plan:   Problem List Items Addressed This Visit      Cardiovascular and Mediastinum   Senile purpura (HCC) - Primary    Discussed skin protection and care        Other   HLD (hyperlipidemia)    Discuss Lipitor and potential indications will discontinue medication this may be affecting patient's ability to walk and this may be causing some myalgias          Follow up plan: Return in about 6 months (around 09/24/2015) for Med check.

## 2015-03-24 NOTE — Assessment & Plan Note (Signed)
Discussed skin protection and care

## 2015-03-24 NOTE — Assessment & Plan Note (Signed)
Discuss Lipitor and potential indications will discontinue medication this may be affecting patient's ability to walk and this may be causing some myalgias

## 2015-04-08 ENCOUNTER — Ambulatory Visit (INDEPENDENT_AMBULATORY_CARE_PROVIDER_SITE_OTHER)
Admission: EM | Admit: 2015-04-08 | Discharge: 2015-04-08 | Disposition: A | Discharge status: Other Acute Inpatient Hospital | Payer: Medicare Other | Source: Home / Self Care | Attending: Emergency Medicine | Admitting: Emergency Medicine

## 2015-04-08 ENCOUNTER — Encounter: Payer: Self-pay | Admitting: *Deleted

## 2015-04-08 DIAGNOSIS — R079 Chest pain, unspecified: Secondary | ICD-10-CM | POA: Diagnosis not present

## 2015-04-08 DIAGNOSIS — S0990XA Unspecified injury of head, initial encounter: Secondary | ICD-10-CM

## 2015-04-08 NOTE — ED Provider Notes (Signed)
[facility]  ____________________________________________  Time seen: Approximately 9:48 AM  I have reviewed the triage vital signs and the nursing notes.   HISTORY  Chief Complaint Chest Pain   HPI Natalie Rollins is a 82 y.o. female presents with husband at bedside who is the historian. Patient with baseline dementia. Husband reports that last I have personally 6 PM patient fell. Husband states that patient has baseline unsteady gait. Patient states that approximately 6 PM last night he was sitting at his desk and his wife got up and walked into his office. Patient states that his wife then turned and lost her balance  causing her to fall. Husband reports that she did hit the back of her head on the end of the wall. Denies loss of consciousness. Denies changes in behavior. However husband does reports that patient has intermittently complained of chest pain as well as "that she can't catch her breath "since the fall. Husband again reports that this is approximate 6 PM last night. Patient reports that she is currently on eliquis.  Husband states that patient overall does appear to be at her baseline. Husband states that the patient has not had any other complaints of pain or discomforts.  PCP: Dr. Yetta Numbers. Husband states that he call doctor's office this morning and did not have any appointments and he was referred to urgent care.   Past Medical History  Diagnosis Date  . PAF (paroxysmal atrial fibrillation) (HCC)     a. unknown if PAF vs persistent at this time given her travel history; b. on ; c. history of post op a-fib 07/2010  . Pituitary mass (HCC)     a. 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 History of stroke x 2    a. left internal capsule CVA 07/2010; b. right stroke in right basal ganglia 2013  . HLD (hyperlipidemia)   . Arthritis     a. s/p lumbar fusion c/b broken hardware w/ removal of the fusion &  subsequent revision most recently 07/2010  . Recurrent UTI   . Hydrocephalus     has a shunt placed   . Stroke (HCC)     x 2  . Dementia   . Allergy     Patient Active Problem List   Diagnosis Date Noted  . Senile purpura (HCC) 03/24/2015  . Right hip pain 11/10/2014  . Chronic pain 09/15/2014  . Allergic rhinitis 09/15/2014  . Atrial fibrillation with RVR (HCC) 09/06/2014  . HLD (hyperlipidemia) 09/06/2014  . Hypothyroidism (acquired) 09/06/2014  . UTI (lower urinary tract infection) 09/06/2014  . Panhypopituitarism (HCC) 09/06/2014  . Atrial fibrillation, unspecified 06/19/2014  . History of stroke 06/19/2014  . Nonverbal 06/19/2014  . Altered mental status 06/19/2014  . History of frequent urinary tract infections 06/19/2014  . AD (Alzheimer's disease) 02/25/2014  . Acquired hypothyroidism 02/25/2014  . 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 Hypotension, iatrogenic 02/25/2014    Past Surgical History  Procedure Laterality Date  . Spine surgery      metal rods places   . Patella fracture surgery      left   . Tibia fracture surgery      right  . Humerus surgery      right    Current Outpatient Rx  Name  Route  Sig  Dispense  Refill  . apixaban (ELIQUIS) 5 MG TABS tablet   Oral   Take 1 tablet (  5 mg total) by mouth 2 (two) times daily.   60 tablet   11   . Ascorbic Acid (VITAMIN C) 1000 MG tablet   Oral   Take 1,000 mg by mouth daily.          . Calcium-Magnesium-Zinc 333-133-5 MG TABS   Oral   Take by mouth daily.          . Cholecalciferol (VITAMIN D) 2000 UNITS tablet   Oral   Take 2,000 Units by mouth daily.         Marland Kitchen desmopressin (DDAVP) 0.1 MG tablet      TAKE 1 TABLET BY MOUTH TWICE A DAY   180 tablet   1   . diltiazem (CARDIZEM CD) 120 MG 24 hr capsule      TAKE ONE CAPSULE BY MOUTH DAILY   90 capsule   1   . glucosamine-chondroitin 500-400 MG tablet   Oral   Take 1 tablet by mouth 2 (two) times  daily.         . hydrocortisone (CORTEF) 10 MG tablet   Oral   Take 40 mg by mouth daily.       1   . levothyroxine (SYNTHROID, LEVOTHROID) 88 MCG tablet   Oral   Take 88 mcg by mouth daily before breakfast.         . Misc Natural Products (GLUCOSAMINE CHONDROITIN ADV) TABS   Oral   Take by mouth 2 (two) times daily.          . Multiple Vitamin (MULTIVITAMIN) tablet   Oral   Take 1 tablet by mouth daily.         Marland Kitchen oxyCODONE-acetaminophen (PERCOCET/ROXICET) 5-325 MG per tablet   Oral   Take 1 tablet by mouth daily as needed for severe pain.         . vitamin E 400 UNIT capsule   Oral   Take 400 Units by mouth daily.            Allergies Review of patient's allergies indicates no known allergies.  Family History  Problem Relation Age of Onset  . Diabetes Son   . Kidney Stones Son     Social History Social History  Substance Use Topics  . Smoking status: Never Smoker   . Smokeless tobacco: Never Used  . Alcohol Use: Yes     Comment: a glass of wine on occasion    Review of Systems: per husband Constitutional: No fever/chills Eyes: No visual changes. ENT: No sore throat. Cardiovascular: positive chest pain. Respiratory: positive shortness of breath. Gastrointestinal: No abdominal pain.  No nausea, no vomiting.  No diarrhea.  No constipation. Genitourinary: Negative for dysuria. Musculoskeletal: Negative for back pain. Skin: Negative for rash. Neurological: Negative for focal weakness or numbness.  10-point ROS otherwise negative.  ____________________________________________   PHYSICAL EXAM:  VITAL SIGNS: ED Triage Vitals  Enc Vitals Group     BP 04/08/15 0939 151/77 mmHg     Pulse Rate 04/08/15 0939 77     Resp 04/08/15 0939 18     Temp 04/08/15 0939 97.5 F (36.4 C)     Temp Source 04/08/15 0939 Oral     SpO2 04/08/15 0939 96 %     Weight 04/08/15 0939 170 lb (77.111 kg)     Height 04/08/15 0939  (1.626 m)     Head Cir --       Peak Flow --      Pain Score --  Pain Loc --      Pain Edu? --      Excl. in GC? --     Constitutional: Alert and oriented to self only. Appears uncomfortable. Eyes: Conjunctivae are normal. PERRL. EOMI. Head: Posterior head occiput ecchymosis with hematoma. Skin intact. No dried blood. Nontender. Palpable right sided VP shunt.   Ears: no erythema, normal TMs bilaterally.   Nose: No congestion/rhinnorhea.  Mouth/Throat: Mucous membranes are moist.  Oropharynx non-erythematous. Neck: No stridor.  No cervical spine tenderness to palpation. Cardiovascular: Normal rate, regular rhythm. Grossly normal heart sounds.  Good peripheral circulation. Respiratory: Normal respiratory effort.  No retractions. Lungs CTAB. Gastrointestinal: Soft and nontender.  Musculoskeletal: No point cervical, thoracic or lumbar tenderness.  Neurologic:  Alert to self only. Moves all extremities freely in bed. Does not follow commands.  Skin:  Skin is warm, dry and intact. No rash noted.   ____________________________________________   LABS (all labs ordered are listed, but only abnormal results are displayed)  Labs Reviewed - No data to display   INITIAL IMPRESSION / ASSESSMENT AND PLAN / ED COURSE  Pertinent labs & imaging results that were available during my care of the patient were reviewed by me and considered in my medical decision making (see chart for details).   patient with multiple comorbidities including dementia with history of subdural hematoma and with right-sided VP shunt. Presented with husband at bedside who is historian. Husband reports last night positive fall with positive head injury, denies loss consciousness. Has also reports intermittent complaints of chest pain and inability to catch her breath. Patient unable to report her complaints. Patient appears uncomfortable during exam and moans to palpation and various areas inconsistently. Patient husband states that this is normal  for her baseline. Patient with posterior head hematoma.  Discussed in detail with patient and husband as patient with recent subdural hematoma and with positive head injury, on eliquis, and for the complaints of chest pain and shortness of breath recommend patient be evaluated at this time emergency room. Recommend patient to be transported by EMS. Husband agrees to this plan and verbalized understanding.  EMS report that they will take patient to [facility] as that is where she is most recently treated for a subdural hematoma proximally one month ago. [facility] transfer line called and report given. Patient stable at the time of transfer.  Discussed follow up with Primary care physician this week. Discussed follow up and return parameters including no resolution or any worsening concerns. Patient verbalized understanding and agreed to plan.   ____________________________________________   FINAL CLINICAL IMPRESSION(S) / ED DIAGNOSES  Final diagnoses:  Head injury, initial encounter  Chest pain, unspecified chest pain type      Note: This dictation was prepared with Dragon dictation along with smaller phrase technology. Any transcriptional errors that result from this process are unintentional.    Renford DillsLindsey Jamarcus Laduke, NP 04/08/15 1535

## 2015-04-08 NOTE — ED Notes (Signed)
EMS called to transport patient to [facility] ED 

## 2015-04-08 NOTE — ED Notes (Signed)
Patient is complaining of pain, and her husband states that she is having chest pain. Patient has dementia.

## 2015-04-10 ENCOUNTER — Telehealth: Payer: Self-pay | Admitting: Family Medicine

## 2015-04-10 ENCOUNTER — Emergency Department: Payer: Medicare Other

## 2015-04-10 ENCOUNTER — Inpatient Hospital Stay
Admission: EM | Admit: 2015-04-10 | Discharge: 2015-04-13 | DRG: 641 | Disposition: A | Discharge status: Home / Self Care | Payer: Medicare Other | Attending: Internal Medicine | Admitting: Internal Medicine

## 2015-04-10 DIAGNOSIS — Z79899 Other long term (current) drug therapy: Secondary | ICD-10-CM

## 2015-04-10 DIAGNOSIS — N390 Urinary tract infection, site not specified: Secondary | ICD-10-CM

## 2015-04-10 DIAGNOSIS — E860 Dehydration: Secondary | ICD-10-CM | POA: Diagnosis present

## 2015-04-10 DIAGNOSIS — G309 Alzheimer's disease, unspecified: Secondary | ICD-10-CM | POA: Diagnosis present

## 2015-04-10 DIAGNOSIS — Z87440 Personal history of urinary (tract) infections: Secondary | ICD-10-CM | POA: Diagnosis not present

## 2015-04-10 DIAGNOSIS — Z982 Presence of cerebrospinal fluid drainage device: Secondary | ICD-10-CM

## 2015-04-10 DIAGNOSIS — M1990 Unspecified osteoarthritis, unspecified site: Secondary | ICD-10-CM | POA: Diagnosis present

## 2015-04-10 DIAGNOSIS — E876 Hypokalemia: Principal | ICD-10-CM | POA: Diagnosis present

## 2015-04-10 DIAGNOSIS — Z9889 Other specified postprocedural states: Secondary | ICD-10-CM

## 2015-04-10 DIAGNOSIS — Z8673 Personal history of transient ischemic attack (TIA), and cerebral infarction without residual deficits: Secondary | ICD-10-CM | POA: Diagnosis not present

## 2015-04-10 DIAGNOSIS — Z833 Family history of diabetes mellitus: Secondary | ICD-10-CM

## 2015-04-10 DIAGNOSIS — Z7901 Long term (current) use of anticoagulants: Secondary | ICD-10-CM

## 2015-04-10 DIAGNOSIS — Z66 Do not resuscitate: Secondary | ICD-10-CM | POA: Diagnosis present

## 2015-04-10 DIAGNOSIS — R17 Unspecified jaundice: Secondary | ICD-10-CM

## 2015-04-10 DIAGNOSIS — I480 Paroxysmal atrial fibrillation: Secondary | ICD-10-CM | POA: Diagnosis present

## 2015-04-10 DIAGNOSIS — Z981 Arthrodesis status: Secondary | ICD-10-CM | POA: Diagnosis not present

## 2015-04-10 DIAGNOSIS — F0280 Dementia in other diseases classified elsewhere without behavioral disturbance: Secondary | ICD-10-CM | POA: Diagnosis present

## 2015-04-10 DIAGNOSIS — H9190 Unspecified hearing loss, unspecified ear: Secondary | ICD-10-CM | POA: Diagnosis present

## 2015-04-10 DIAGNOSIS — R4182 Altered mental status, unspecified: Secondary | ICD-10-CM

## 2015-04-10 DIAGNOSIS — R531 Weakness: Secondary | ICD-10-CM

## 2015-04-10 LAB — COMPREHENSIVE METABOLIC PANEL
ALT: 17 U/L (ref 14–54)
AST: 23 U/L (ref 15–41)
Albumin: 3.6 g/dL (ref 3.5–5.0)
Alkaline Phosphatase: 116 U/L (ref 38–126)
Anion gap: 10 (ref 5–15)
BUN: 7 mg/dL (ref 6–20)
CHLORIDE: 106 mmol/L (ref 101–111)
CO2: 22 mmol/L (ref 22–32)
CREATININE: 0.78 mg/dL (ref 0.44–1.00)
Calcium: 8.2 mg/dL — ABNORMAL LOW (ref 8.9–10.3)
GFR calc non Af Amer: >60 mL/min (ref >60)
Glucose, Bld: 112 mg/dL — ABNORMAL HIGH (ref 65–99)
POTASSIUM: 2.7 mmol/L — AB (ref 3.5–5.1)
SODIUM: 138 mmol/L (ref 135–145)
Total Bilirubin: 2.3 mg/dL — ABNORMAL HIGH (ref 0.3–1.2)
Total Protein: 6.5 g/dL (ref 6.5–8.1)

## 2015-04-10 LAB — URINALYSIS COMPLETE WITH MICROSCOPIC (ARMC ONLY)
BILIRUBIN URINE: NEGATIVE
Bacteria, UA: NONE SEEN
GLUCOSE, UA: NEGATIVE mg/dL
HGB URINE DIPSTICK: NEGATIVE
LEUKOCYTES UA: NEGATIVE
Nitrite: NEGATIVE
PH: 6 (ref 5.0–8.0)
Protein, ur: 30 mg/dL — AB
SPECIFIC GRAVITY, URINE: 1.011 (ref 1.005–1.030)

## 2015-04-10 LAB — CBC WITH DIFFERENTIAL/PLATELET
BASOS ABS: 0.1 10*3/uL (ref 0–0.1)
Basophils Relative: 1 %
EOS ABS: 0.3 10*3/uL (ref 0–0.7)
EOS PCT: 3 %
HCT: 37.7 % (ref 35.0–47.0)
Hemoglobin: 12.6 g/dL (ref 12.0–16.0)
Lymphocytes Relative: 28 %
Lymphs Abs: 3 10*3/uL (ref 1.0–3.6)
MCH: 29.7 pg (ref 26.0–34.0)
MCHC: 33.5 g/dL (ref 32.0–36.0)
MCV: 88.6 fL (ref 80.0–100.0)
MONO ABS: 0.9 10*3/uL (ref 0.2–0.9)
Monocytes Relative: 8 %
Neutro Abs: 6.3 10*3/uL (ref 1.4–6.5)
Neutrophils Relative %: 60 %
PLATELETS: 226 10*3/uL (ref 150–440)
RBC: 4.25 MIL/uL (ref 3.80–5.20)
RDW: 16.1 % — AB (ref 11.5–14.5)
WBC: 10.7 10*3/uL (ref 3.6–11.0)

## 2015-04-10 LAB — TROPONIN I

## 2015-04-10 LAB — MAGNESIUM: MAGNESIUM: 1.7 mg/dL (ref 1.7–2.4)

## 2015-04-10 MED ORDER — DILTIAZEM HCL ER COATED BEADS 120 MG PO CP24
120.0000 mg | ORAL_CAPSULE | Freq: Every day | ORAL | Status: DC
  Administered 2015-04-11 – 2015-04-13 (×3): 120 mg via ORAL
  Filled 2015-04-10 (×4): qty 1

## 2015-04-10 MED ORDER — APIXABAN 5 MG PO TABS
5.0000 mg | ORAL_TABLET | Freq: Two times a day (BID) | ORAL | Status: DC
  Administered 2015-04-10 – 2015-04-11 (×2): 5 mg via ORAL
  Filled 2015-04-10 (×2): qty 1

## 2015-04-10 MED ORDER — DEXTROSE 5 % IV SOLN
1.0000 g | INTRAVENOUS | Status: DC
  Administered 2015-04-10 – 2015-04-12 (×3): 1 g via INTRAVENOUS
  Filled 2015-04-10 (×4): qty 10

## 2015-04-10 MED ORDER — POTASSIUM CHLORIDE CRYS ER 20 MEQ PO TBCR
40.0000 meq | EXTENDED_RELEASE_TABLET | Freq: Once | ORAL | Status: AC
  Administered 2015-04-10: 40 meq via ORAL
  Filled 2015-04-10: qty 2

## 2015-04-10 MED ORDER — LEVOTHYROXINE SODIUM 88 MCG PO TABS
88.0000 ug | ORAL_TABLET | Freq: Every day | ORAL | Status: DC
  Administered 2015-04-11 – 2015-04-13 (×3): 88 ug via ORAL
  Filled 2015-04-10 (×3): qty 1

## 2015-04-10 MED ORDER — ACETAMINOPHEN 325 MG PO TABS: 650.0000 mg | ORAL_TABLET | Freq: Four times a day (QID) | ORAL | Status: DC | PRN

## 2015-04-10 MED ORDER — ALBUTEROL SULFATE (2.5 MG/3ML) 0.083% IN NEBU: 2.5000 mg | INHALATION_SOLUTION | RESPIRATORY_TRACT | Status: DC | PRN

## 2015-04-10 MED ORDER — HYDROCORTISONE 10 MG PO TABS
20.0000 mg | ORAL_TABLET | Freq: Three times a day (TID) | ORAL | Status: DC
  Administered 2015-04-11 – 2015-04-13 (×7): 20 mg via ORAL
  Filled 2015-04-10 (×3): qty 2
  Filled 2015-04-10: qty 1
  Filled 2015-04-10 (×11): qty 2

## 2015-04-10 MED ORDER — CALCIUM-MAGNESIUM-ZINC 333-133-5 MG PO TABS: 1.0000 | ORAL_TABLET | Freq: Every day | ORAL | Status: DC

## 2015-04-10 MED ORDER — POTASSIUM CHLORIDE IN NACL 20-0.9 MEQ/L-% IV SOLN
INTRAVENOUS | Status: DC
Start: 2015-04-10 — End: 2015-04-13
  Administered 2015-04-10 – 2015-04-13 (×5): via INTRAVENOUS
  Filled 2015-04-10 (×6): qty 1000

## 2015-04-10 MED ORDER — ONDANSETRON HCL 4 MG/2ML IJ SOLN: 4.0000 mg | Freq: Four times a day (QID) | INTRAMUSCULAR | Status: DC | PRN

## 2015-04-10 MED ORDER — MAGNESIUM OXIDE 400 (241.3 MG) MG PO TABS
400.0000 mg | ORAL_TABLET | Freq: Every day | ORAL | Status: DC
  Administered 2015-04-11 – 2015-04-13 (×3): 400 mg via ORAL
  Filled 2015-04-10 (×3): qty 1

## 2015-04-10 MED ORDER — ATORVASTATIN CALCIUM 10 MG PO TABS
10.0000 mg | ORAL_TABLET | Freq: Every day | ORAL | Status: DC
  Administered 2015-04-10 – 2015-04-12 (×3): 10 mg via ORAL
  Filled 2015-04-10 (×3): qty 1

## 2015-04-10 MED ORDER — KCL IN DEXTROSE-NACL 40-5-0.45 MEQ/L-%-% IV SOLN
INTRAVENOUS | Status: DC
  Administered 2015-04-10: 1 mL via INTRAVENOUS
  Filled 2015-04-10 (×5): qty 1000

## 2015-04-10 MED ORDER — CALCIUM CARBONATE ANTACID 500 MG PO CHEW
1.0000 | CHEWABLE_TABLET | Freq: Every day | ORAL | Status: DC
  Administered 2015-04-11 – 2015-04-13 (×3): 200 mg via ORAL
  Filled 2015-04-10 (×3): qty 1

## 2015-04-10 MED ORDER — ZINC SULFATE 220 (50 ZN) MG PO CAPS
220.0000 mg | ORAL_CAPSULE | Freq: Every day | ORAL | Status: DC
  Administered 2015-04-11 – 2015-04-13 (×3): 220 mg via ORAL
  Filled 2015-04-10 (×4): qty 1

## 2015-04-10 MED ORDER — ACETAMINOPHEN 650 MG RE SUPP: 650.0000 mg | Freq: Four times a day (QID) | RECTAL | Status: DC | PRN

## 2015-04-10 MED ORDER — DESMOPRESSIN ACETATE 0.2 MG PO TABS
0.1000 mg | ORAL_TABLET | Freq: Two times a day (BID) | ORAL | Status: DC
  Administered 2015-04-11 – 2015-04-13 (×6): 0.1 mg via ORAL
  Filled 2015-04-10 (×8): qty 1

## 2015-04-10 MED ORDER — OXYCODONE-ACETAMINOPHEN 5-325 MG PO TABS
1.0000 | ORAL_TABLET | Freq: Four times a day (QID) | ORAL | Status: DC | PRN
  Administered 2015-04-12: 17:00:00 1 via ORAL
  Filled 2015-04-10: qty 1

## 2015-04-10 MED ORDER — AMOXICILLIN 500 MG PO CAPS: 500.0000 mg | ORAL_CAPSULE | Freq: Two times a day (BID) | ORAL | Status: DC

## 2015-04-10 MED ORDER — ONDANSETRON HCL 4 MG PO TABS: 4.0000 mg | ORAL_TABLET | Freq: Four times a day (QID) | ORAL | Status: DC | PRN

## 2015-04-10 NOTE — ED Notes (Signed)
[facility]. [facility] for a fall per husband.  [facility]. [facility] with [facility] dx per husband.  Pt. Husband reports pt. Was going to store this afternoon where she became unresponsive in car.

## 2015-04-10 NOTE — ED Provider Notes (Signed)
[facility] Emergency Department Provider Note     Time seen: ----------------------------------------- 6:56 PM on 04/10/2015 -----------------------------------------  Level V caveat: Review of systems and history is limited by altered mental status.  I have reviewed the triage vital signs and the nursing notes.   HISTORY  Chief Complaint No chief complaint on file.    HPI Natalie Rollins is a 82 y.o. female who presents the ER for altered mental status.Husband states she was [facility] and [facility] after [facility]. Reported she had normal x-ray and normal CT scan. He states she's been very weak today and has had altered mental status. She recently has had subdural hematoma one month ago. She has not been ill otherwise, no other information or history is available.   Past Medical History  Diagnosis Date  . PAF (paroxysmal atrial fibrillation) (HCC)     a. unknown if PAF vs persistent at this time given her travel history; b. on ; c. history of post op a-fib 07/2010  . Pituitary mass (HCC)     a. 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 History of stroke x 2    a. left internal capsule CVA 07/2010; b. right stroke in right basal ganglia 2013  . HLD (hyperlipidemia)   . Arthritis     a. s/p lumbar fusion c/b broken hardware w/ removal of the fusion & subsequent revision most recently 07/2010  . Recurrent UTI   . Hydrocephalus     has a shunt placed   . Stroke (HCC)     x 2  . Dementia   . Allergy     Patient Active Problem List   Diagnosis Date Noted  . Senile purpura (HCC) 03/24/2015  . Right hip pain 11/10/2014  . Chronic pain 09/15/2014  . Allergic rhinitis 09/15/2014  . Atrial fibrillation with RVR (HCC) 09/06/2014  . HLD (hyperlipidemia) 09/06/2014  . Hypothyroidism (acquired) 09/06/2014  . UTI (lower urinary tract infection) 09/06/2014  . Panhypopituitarism (HCC)  09/06/2014  . Atrial fibrillation, unspecified 06/19/2014  . History of stroke 06/19/2014  . Nonverbal 06/19/2014  . Altered mental status 06/19/2014  . History of frequent urinary tract infections 06/19/2014  . AD (Alzheimer's disease) 02/25/2014  . Acquired hypothyroidism 02/25/2014  . 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 Hypotension, iatrogenic 02/25/2014    Past Surgical History  Procedure Laterality Date  . Spine surgery      metal rods places   . Patella fracture surgery      left   . Tibia fracture surgery      right  . Humerus surgery      right    Allergies Review of patient's allergies indicates no known allergies.  Social History Social History  Substance Use Topics  . Smoking status: Never Smoker   . Smokeless tobacco: Never Used  . Alcohol Use: Yes     Comment: a glass of wine on occasion    Review of Systems Positive for weakness and altered mental status ____________________________________________   PHYSICAL EXAM:  VITAL SIGNS: ED Triage Vitals  Enc Vitals Group     BP --      Pulse --      Resp --      Temp --      Temp src --      SpO2 --      Weight --  Height --      Head Cir --      Peak Flow --      Pain Score --      Pain Loc --      Pain Edu? --      Excl. in GC? --     Constitutional: Intermittently alert, localizes to pain, disoriented Eyes: Conjunctivae are normal. PERRL. Normal extraocular movements. ENT   Head: Normocephalic and atraumatic.   Nose: No congestion/rhinnorhea.   Mouth/Throat: Mucous membranes are moist.   Neck: No stridor. Cardiovascular: Irregularly irregular rhythm. Normal and symmetric distal pulses are present in all extremities. No murmurs, rubs, or gallops. Respiratory: Normal respiratory effort without tachypnea nor retractions. Breath sounds are clear and equal bilaterally. No wheezes/rales/rhonchi. Gastrointestinal: Soft and nontender. No distention.  No abdominal bruits.  Musculoskeletal: Nontender with normal range of motion in all extremities. No joint effusions.  No lower extremity tenderness nor edema. Neurologic:  Patient has normal speech but is disoriented, localizes to pain. Appears to be moving extremities but does have generalized weakness Skin:  Skin is warm, dry and intact. No rash noted. ____________________________________________  EKG: Interpreted by me. Sinus tachycardia with a rate of 113 bpm, normal axis. Normal PR interval, normal QRS. Premature atrial contractions.  ____________________________________________  ED COURSE:  Pertinent labs & imaging results that were available during my care of the patient were reviewed by me and considered in my medical decision making (see chart for details). Patient is altered, we'll obtain CT imaging and basic labs. ____________________________________________    LABS (pertinent positives/negatives)  Labs Reviewed  CBC WITH DIFFERENTIAL/PLATELET - Abnormal; Notable for the following:    RDW 16.1 (*)    All other components within normal limits  COMPREHENSIVE METABOLIC PANEL - Abnormal; Notable for the following:    Potassium 2.7 (*)    Glucose, Bld 112 (*)    Calcium 8.2 (*)    Total Bilirubin 2.3 (*)    All other components within normal limits  TROPONIN I  URINALYSIS COMPLETEWITH MICROSCOPIC ([facility] ONLY)    RADIOLOGY Images were viewed by me  CT head, chest x-ray IMPRESSION: No active disease. IMPRESSION: 1. Significant improvement in previous bilateral subdural hematoma. The right subdural hematoma has resolved. Only tiny residual left subdural chronic hematoma measures 3 mm thickness. No evidence of acute hemorrhage or blood products. 2. Again noted VP shunt catheter. Ventricular size is stable from prior exam. 3. Stable atrophy and chronic white matter disease. Stable small lacunar infarcts bilateral basal ganglia. No definite acute  cortical infarction 4. Stable meningioma in suprasellar region. ____________________________________________  FINAL ASSESSMENT AND PLAN  Altered mental status, weakness, hypokalemia  Plan: Patient with labs and imaging as dictated above. Patient's been started on D5 half-normal saline with 40 mEq of potassium. This may be the cause of her weakness. Urinalysis is pending, will discuss with the hospitalist for admission.   Emily Filbert, MD   Emily Filbert, MD 04/10/15 [phone]

## 2015-04-10 NOTE — ED Notes (Signed)
Pt found to be unresponsive in the front of her husband's vehicle, pt bent over the front seat and her knees in the floor board, with some stimulation pt awakens but isn't talking much, pt is lifted out of the vehicle and placed on the stretcher by hospital staff, husband reports that [facility] her [facility] hematoma but is also unable to give many details, pt is moaning and communicating very little

## 2015-04-10 NOTE — H&P (Addendum)
[facility] [place] [facility]   PATIENT NAME: Natalie Rollins    MR#:  [id number]  DATE OF BIRTH:  [birth year]  DATE OF ADMISSION:  04/10/2015  PRIMARY CARE PHYSICIAN: Vonita Moss, MD   REQUESTING/REFERRING PHYSICIAN: Emily Filbert, MD  CHIEF COMPLAINT:   Chief Complaint  Patient presents with  . Altered Mental Status  Confusion today.  HISTORY OF PRESENT ILLNESS:  Natalie Rollins  is a 82 y.o. female with a known history of Paroxysmal A. fib, pituitary mass, CVA, dementia and hyperlipidemia. She was discharged from [facility] 2 days ago after a fall. She had a normal x-ray and normal CT scan. She has been feeling weak today and confused. Patient has no complaints, she is a poor historian. According to her husband, patient to has polyuria and incontinent. She has recurrent UTI. She was found to have slow potassium is 2.7. Urinalysis is pending. The patient had subdural hematoma 1 months ago, she is still taking Eliquis per her husband.  PAST MEDICAL HISTORY:   Past Medical History  Diagnosis Date  . PAF (paroxysmal atrial fibrillation) (HCC)     a. unknown if PAF vs persistent at this time given her travel history; b. on ; c. history of post op a-fib 07/2010  . Pituitary mass (HCC)     a. 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 History of stroke x 2    a. left internal capsule CVA 07/2010; b. right stroke in right basal ganglia 2013  . HLD (hyperlipidemia)   . Arthritis     a. s/p lumbar fusion c/b broken hardware w/ removal of the fusion & subsequent revision most recently 07/2010  . Recurrent UTI   . Hydrocephalus     has a shunt placed   . Stroke (HCC)     x 2  . Dementia   . Allergy     PAST SURGICAL HISTORY:   Past Surgical History  Procedure Laterality Date  . Spine surgery      metal rods places   . Patella fracture surgery      left   . Tibia fracture surgery      right  . Humerus  surgery      right    SOCIAL HISTORY:   Social History  Substance Use Topics  . Smoking status: Never Smoker   . Smokeless tobacco: Never Used  . Alcohol Use: Yes     Comment: a glass of wine on occasion    FAMILY HISTORY:   Family History  Problem Relation Age of Onset  . Diabetes Son   . Kidney Stones Son     DRUG ALLERGIES:  No Known Allergies  REVIEW OF SYSTEMS:  CONSTITUTIONAL: No fever, But has generalized weakness.  EYES: No blurred or double vision.  EARS, NOSE, AND THROAT: No tinnitus or ear pain.  RESPIRATORY: No cough, shortness of breath, wheezing or hemoptysis.  CARDIOVASCULAR: No chest pain, orthopnea, edema.  GASTROINTESTINAL: No nausea, vomiting, diarrhea or abdominal pain.  GENITOURINARY: No dysuria, hematuria.  ENDOCRINE: No polyuria, nocturia,  HEMATOLOGY: No anemia, easy bruising or bleeding SKIN: No rash or lesion. MUSCULOSKELETAL: No joint pain or arthritis.   NEUROLOGIC: No tingling, numbness, weakness.  PSYCHIATRY: No anxiety or depression.   MEDICATIONS AT HOME:   Prior to Admission medications   Medication Sig Start Date End Date Taking? Authorizing Provider  amoxicillin (AMOXIL) 500 MG capsule Take 1 capsule (500 mg  total) by mouth 2 (two) times daily. 04/10/15  Yes Megan P Johnson, DO  atorvastatin (LIPITOR) 10 MG tablet Take 10 mg by mouth at bedtime.   Yes Historical Provider, MD  Calcium-Magnesium-Zinc [phone] MG TABS Take 1 tablet by mouth daily.    Yes Historical Provider, MD  cholecalciferol (VITAMIN D) 1000 units tablet Take 2,000 Units by mouth daily.   Yes Historical Provider, MD  desmopressin (DDAVP) 0.1 MG tablet Take 0.1 mg by mouth 2 (two) times daily.   Yes Historical Provider, MD  diltiazem (CARDIZEM CD) 120 MG 24 hr capsule Take 120 mg by mouth daily.   Yes Historical Provider, MD  glucosamine-chondroitin 500-400 MG tablet Take 1 tablet by mouth 2 (two) times daily.   Yes Historical Provider, MD  hydrocortisone (CORTEF)  10 MG tablet Take 10 mg by mouth 4 (four) times daily.    Yes Historical Provider, MD  levothyroxine (SYNTHROID, LEVOTHROID) 88 MCG tablet Take 88 mcg by mouth daily before breakfast.   Yes Historical Provider, MD  Multiple Vitamin (MULTIVITAMIN WITH MINERALS) TABS tablet Take 1 tablet by mouth daily.   Yes Historical Provider, MD  oxyCODONE-acetaminophen (PERCOCET/ROXICET) 5-325 MG per tablet Take 1 tablet by mouth every 6 (six) hours as needed for severe pain.    Yes Historical Provider, MD  vitamin C (ASCORBIC ACID) 500 MG tablet Take 1,000 mg by mouth daily.   Yes Historical Provider, MD  vitamin E 400 UNIT capsule Take 400 Units by mouth daily.    Yes Historical Provider, MD      VITAL SIGNS:  Blood pressure 133/79, pulse 83, temperature 97.9 F (36.6 C), temperature source Oral, resp. rate 16, height 5\' 4"  (1.626 m), weight 77.111 kg (170 lb), SpO2 93 %.  PHYSICAL EXAMINATION:  GENERAL:  82 y.o.-year-old patient lying in the bed with no acute distress. Obese. EYES: Pupils equal, round, reactive to light and accommodation. No scleral icterus. Extraocular muscles intact.  HEENT: Head atraumatic, normocephalic. Oropharynx and nasopharynx clear.  NECK:  Supple, no jugular venous distention. No thyroid enlargement, no tenderness.  LUNGS: Normal breath sounds bilaterally, no wheezing, rales,rhonchi or crepitation. No use of accessory muscles of respiration.  CARDIOVASCULAR: S1, S2 normal. No murmurs, rubs, or gallops.  ABDOMEN: Soft, nontender, nondistended. Bowel sounds present. No organomegaly or mass.  EXTREMITIES: No pedal edema, cyanosis, or clubbing.  NEUROLOGIC: Cranial nerves II through XII are intact. Muscle strength 4/5 in all extremities. Sensation intact. Gait not checked.  PSYCHIATRIC: The patient is alert and oriented x 2.  SKIN: No obvious rash, lesion, or ulcer.   LABORATORY PANEL:   CBC  Recent Labs Lab 04/10/15 1910  WBC 10.7  HGB 12.6  HCT 37.7  PLT 226    ------------------------------------------------------------------------------------------------------------------  Chemistries   Recent Labs Lab 04/10/15 1910  NA 138  K 2.7*  CL 106  CO2 22  GLUCOSE 112*  BUN 7  CREATININE 0.78  CALCIUM 8.2*  AST 23  ALT 17  ALKPHOS 116  BILITOT 2.3*   ------------------------------------------------------------------------------------------------------------------  Cardiac Enzymes  Recent Labs Lab 04/10/15 1910  TROPONINI <0.03   ------------------------------------------------------------------------------------------------------------------  RADIOLOGY:  Dg Chest 1 View  04/10/2015  CLINICAL DATA:  Weakness.  Found unresponsive today. EXAM: CHEST 1 VIEW COMPARISON:  03/14/2015 FINDINGS: VP shunt tubing is again noted in the lower right neck and right chest. Cardiomediastinal silhouette is unchanged. Slight elevation of the right hemidiaphragm is unchanged. No airspace consolidation, edema, pleural effusion, or pneumothorax is identified. Sequelae of prior thoracolumbar posterior  fusion are again identified with grossly similar appearance of bilateral interconnecting rod disruption. An old right humeral neck fracture is noted. IMPRESSION: No active disease. Electronically Signed   By: Sebastian Ache M.D.   On: 04/10/2015 19:59   Ct Head Wo Contrast  04/10/2015  CLINICAL DATA:  Altered mental status EXAM: CT HEAD WITHOUT CONTRAST TECHNIQUE: Contiguous axial images were obtained from the base of the skull through the vertex without intravenous contrast. COMPARISON:  03/14/2015 FINDINGS: No intracranial hemorrhage, mass effect or midline shift. Chronic opacification of the left sphenoid sinus again noted. Again noted fluid and opacification of left mastoid air cells. The maxillary sinuses are unremarkable. Again noted sellar region meningioma which is unchanged from prior exam. Stable VP shunt catheter position. There is no intraventricular  hemorrhage. Ventricular size is stable from prior exam. Again noted cerebral atrophy and periventricular chronic white matter disease. Stable lacunar infarcts in basal ganglia. Stable patchy subcortical chronic white matter decreased attenuation. There is improvement in previous subdural hematomas. The right subdural hematoma has resolved. Only minimal left subdural hematoma with improvement from prior exam measures 3 mm thickness decreased from prior exam. There is no evidence of acute hemorrhage within this chronic appearing hematoma. No definite acute cortical infarction. IMPRESSION: 1. Significant improvement in previous bilateral subdural hematoma. The right subdural hematoma has resolved. Only tiny residual left subdural chronic hematoma measures 3 mm thickness. No evidence of acute hemorrhage or blood products. 2. Again noted VP shunt catheter. Ventricular size is stable from prior exam. 3. Stable atrophy and chronic white matter disease. Stable small lacunar infarcts bilateral basal ganglia. No definite acute cortical infarction 4. Stable meningioma in suprasellar region. Electronically Signed   By: Natasha Mead M.D.   On: 04/10/2015 20:03    EKG:   Orders placed or performed during the hospital encounter of 04/10/15  . ED EKG  . ED EKG  . EKG 12-Lead  . EKG 12-Lead    IMPRESSION AND PLAN:   Hypokalemia IV and by mouth potassium, follow-up BMP and magnesium level.  Altered mental status Aspiration and fall precaution.   UTI. Start rocephin, follow up urine culture.  generalized weakness PT Evaluation.  Elevated bilirubin. Follow-up abdominal ultrasound and CMP.  History of A. Fib, on eliquis.   All the records are reviewed and case discussed with ED provider. Management plans discussed with the patient, Her husband and they are in agreement.  CODE STATUS: DO NOT RESUSCITATE  TOTAL TIME TAKING CARE OF THIS PATIENT: 56 minutes.    Shaune Pollack M.D on 04/10/2015 at 8:55  PM  Between 7am to 6pm - Pager - [phone]  After 6pm go to [email or web address] - password [email or web address] [facility]  [facility]  Office  [phone]  CC: Primary care physician; Vonita Moss, MD

## 2015-04-10 NOTE — Telephone Encounter (Signed)
Rx sent to her pharmacy 

## 2015-04-10 NOTE — ED Notes (Addendum)
CRITICAL POTASSIUM: 2.7, Matt RN and Dr Darnelle CatalanMalinda notified

## 2015-04-10 NOTE — Telephone Encounter (Signed)
Routing to provider  

## 2015-04-10 NOTE — Telephone Encounter (Signed)
Patient husband called stating that patient has a possible UTI and wants to know if she can have something called in to her pharmacy.

## 2015-04-11 ENCOUNTER — Inpatient Hospital Stay: Payer: Medicare Other

## 2015-04-11 DIAGNOSIS — R17 Unspecified jaundice: Secondary | ICD-10-CM

## 2015-04-11 LAB — BASIC METABOLIC PANEL
Anion gap: 5 (ref 5–15)
BUN: 8 mg/dL (ref 6–20)
CALCIUM: 7.8 mg/dL — AB (ref 8.9–10.3)
CO2: 23 mmol/L (ref 22–32)
CREATININE: 0.63 mg/dL (ref 0.44–1.00)
Chloride: 111 mmol/L (ref 101–111)
GFR calc Af Amer: >60 mL/min (ref >60)
GLUCOSE: 81 mg/dL (ref 65–99)
POTASSIUM: 3.6 mmol/L (ref 3.5–5.1)
SODIUM: 139 mmol/L (ref 135–145)

## 2015-04-11 LAB — CBC
HEMATOCRIT: 35.2 % (ref 35.0–47.0)
Hemoglobin: 11.7 g/dL — ABNORMAL LOW (ref 12.0–16.0)
MCH: 29.2 pg (ref 26.0–34.0)
MCHC: 33.3 g/dL (ref 32.0–36.0)
MCV: 88 fL (ref 80.0–100.0)
PLATELETS: 193 10*3/uL (ref 150–440)
RBC: 4 MIL/uL (ref 3.80–5.20)
RDW: 16.2 % — AB (ref 11.5–14.5)
WBC: 7.8 10*3/uL (ref 3.6–11.0)

## 2015-04-11 MED ORDER — ALPRAZOLAM 0.25 MG PO TABS
0.2500 mg | ORAL_TABLET | Freq: Three times a day (TID) | ORAL | Status: DC | PRN
  Administered 2015-04-12: 11:00:00 0.25 mg via ORAL
  Filled 2015-04-11: qty 1

## 2015-04-11 NOTE — Plan of Care (Signed)
Problem: Education: Goal: Knowledge of [facility] Education information/materials will improve Outcome: Not Progressing Pt alert to self. Minimal speech, able to state her name.  Problem: Physical Regulation: Goal: Ability to maintain clinical measurements within normal limits will improve Outcome: Progressing No neuro changes during the shift. In am Respirations 30, O2 sats 88% on 3L O2 per [place], pt anxious at that time. Increased O2 to 4L, O2 sats up to 93%, respirations down to 26. MD aware. VSS. No signs of pain nor discomfort.

## 2015-04-11 NOTE — Progress Notes (Signed)
PT Cancellation Note  Patient Details Name: Fonnie Muancy Stevens Lutze MRN: [id number] DOB: 1929/01/25   Cancelled Treatment:    Reason Eval/Treat Not Completed: Patient's level of consciousness PT eval was attempted. pt was unable to answer questions, consent to treatment, or follow instructions to participate in PT eval today.    Carlyon ShadowAshley C. Matvey Llanas, PT, DPT #[id number]  Bently Wyss 04/11/2015, 10:05 AM

## 2015-04-11 NOTE — Consult Note (Signed)
Patient ID: Natalie MuNancy Stevens Szafranski, female   DOB: 27-Feb-1929, 82 y.o.   MRN: [id number]  Consult for possible cholecystitis  HPI Natalie Muancy Stevens Rollins is a 82 y.o. female admitted to the medicine service, surgery consult requested by Dr. Amado CoeGouru for evaluation of possible cholecystitis. Patient was admitted yesterday evening for evaluation of hypokalemia. Patient has no complaints but is a poor historian. There is no family at the bedside during my consultation. She denies any abdominal pain. However she does have a baseline dementia. Per the chart she was [facility] after [facility]. She has a history of recurrent UTIs and is one month out from a subdural hematoma.  HPI  Past Medical History  Diagnosis Date  . PAF (paroxysmal atrial fibrillation) (HCC)     a. unknown if PAF vs persistent at this time given her travel history; b. on ; c. history of post op a-fib 07/2010  . Pituitary mass (HCC)     a. 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 History of stroke x 2    a. left internal capsule CVA 07/2010; b. right stroke in right basal ganglia 2013  . HLD (hyperlipidemia)   . Arthritis     a. s/p lumbar fusion c/b broken hardware w/ removal of the fusion & subsequent revision most recently 07/2010  . Recurrent UTI   . Hydrocephalus     has a shunt placed   . Stroke (HCC)     x 2  . Dementia   . Allergy     Past Surgical History  Procedure Laterality Date  . Spine surgery      metal rods places   . Patella fracture surgery      left   . Tibia fracture surgery      right  . Humerus surgery      right    Family History  Problem Relation Age of Onset  . Diabetes Son   . Kidney Stones Son     Social History Social History  Substance Use Topics  . Smoking status: Never Smoker   . Smokeless tobacco: Never Used  . Alcohol Use: Yes     Comment: a glass of wine on occasion    No Known Allergies  Current  Facility-Administered Medications  Medication Dose Route Frequency Provider Last Rate Last Dose  . 0.9 % NaCl with KCl 20 mEq/ L  infusion   Intravenous Continuous Shaune PollackQing Chen, MD 75 mL/hr at 04/11/15 1332    . acetaminophen (TYLENOL) tablet 650 mg  650 mg Oral Q6H PRN Shaune PollackQing Chen, MD       Or  . acetaminophen (TYLENOL) suppository 650 mg  650 mg Rectal Q6H PRN Shaune PollackQing Chen, MD      . albuterol (PROVENTIL) (2.5 MG/3ML) 0.083% nebulizer solution 2.5 mg  2.5 mg Nebulization Q2H PRN Shaune PollackQing Chen, MD      . ALPRAZolam Prudy Feeler(XANAX) tablet 0.25 mg  0.25 mg Oral TID PRN Ramonita LabAruna Gouru, MD      . atorvastatin (LIPITOR) tablet 10 mg  10 mg Oral QHS Shaune PollackQing Chen, MD   10 mg at 04/10/15 2314  . calcium carbonate (TUMS - dosed in mg elemental calcium) chewable tablet 200 mg of elemental calcium  1 tablet Oral Daily Shaune PollackQing Chen, MD   200 mg of elemental calcium at 04/11/15 0921  . cefTRIAXone (ROCEPHIN) 1 g in dextrose 5 % 50 mL IVPB  1 g Intravenous Q24H Shaune PollackQing Chen,  MD   1 g at 04/10/15 2331  . desmopressin (DDAVP) tablet 0.1 mg  0.1 mg Oral BID Shaune Pollack, MD   0.1 mg at 04/11/15 1610  . diltiazem (CARDIZEM CD) 24 hr capsule 120 mg  120 mg Oral Daily Shaune Pollack, MD   120 mg at 04/11/15 9604  . hydrocortisone (CORTEF) tablet 20 mg  20 mg Oral TID [facility] Shaune Pollack, MD   20 mg at 04/11/15 1730  . levothyroxine (SYNTHROID, LEVOTHROID) tablet 88 mcg  88 mcg Oral QAC breakfast Shaune Pollack, MD   88 mcg at 04/11/15 5409  . magnesium oxide (MAG-OX) tablet 400 mg  400 mg Oral Daily Shaune Pollack, MD   400 mg at 04/11/15 8119  . ondansetron (ZOFRAN) tablet 4 mg  4 mg Oral Q6H PRN Shaune Pollack, MD       Or  . ondansetron [facility]) injection 4 mg  4 mg Intravenous Q6H PRN Shaune Pollack, MD      . oxyCODONE-acetaminophen (PERCOCET/ROXICET) 5-325 MG per tablet 1 tablet  1 tablet Oral Q6H PRN Shaune Pollack, MD      . zinc sulfate capsule 220 mg  220 mg Oral Daily Shaune Pollack, MD   220 mg at 04/11/15 1478     Review of Systems Unable to assess secondary to baseline  dementia  Physical Exam Blood pressure 122/66, pulse 62, temperature 98.7 F (37.1 C), temperature source Oral, resp. rate 24, height  (1.626 m), weight 77.111 kg (170 lb), SpO2 93 %. CONSTITUTIONAL: No acute distress. EYES: Pupils are equal, round, and reactive to light, Sclera are non-icteric. EARS, NOSE, MOUTH AND THROAT: The oropharynx is clear. The oral mucosa is pink and moist. Hearing is intact to voice. LYMPH NODES:  Lymph nodes in the neck are normal. RESPIRATORY:  Lungs are clear. Marland Kitchen CARDIOVASCULAR: Heart is regular GI: The abdomen is completely benign, soft, nontender, and nondistended. There are no palpable masses. There is no hepatosplenomegaly. There are normal bowel sounds in all quadrants. GU: Rectal deferred.   MUSCULOSKELETAL: No cyanosis or edema.   SKIN: No obvious rash or ulcers. NEUROLOGIC:Cranial nerves are grossly intact. PSYCH:  Patient is alert and oriented 2  Data Reviewed Images and labs reviewed. LFTs were not checked today however there is no leukocytosis and her baseline metabolic panel is within normal limits LFTs on admission show a mildly elevated bilirubin at 2.3 but no elevation in transaminases or alkaline phosphatase. She was profoundly hypokalemic on admission at 2.7. Ultrasound was obtained but is able poor quality study. The gallbladder is not very well visualized however the actual gallbladder itself that is visualized shows a non-thickened gallbladder wall at 2 mm a nondilated common duct 2 mm and no evidence of obvious pericholecystic fluid. Further report its documented as having a sonographic Murphy sign but there is no visible evidence of cholecystitis I have personally reviewed the patient's imaging, laboratory findings and medical records.    Assessment    Hyperbilirubinemia    Plan    82 year old demented female admitted for hypokalemia and altered mental status from baseline dementia also with a mild hyperbilirubinemia on admission.  LFTs not checked today. Patient is a poor historian but her exam is completely benign. No evidence of acute cholecystitis. Patient is a poor operative candidate regardless of whether or not she has acute cholecystitis secondary to recent subdural hematoma and recent use of Elliquis. No current indications for any surgical intervention.     Time spent with the patient was  30 minutes, with more than 50% of the time spent in face-to-face education, counseling and care coordination.     Ricarda Frame, MD FACS General Surgeon 04/11/2015, 5:38 PM

## 2015-04-11 NOTE — Progress Notes (Signed)
[facility] Physicians - [place] at [facility]   PATIENT NAME: Natalie Rollins    MR#:  [id number]  DATE OF BIRTH:  31-Dec-1929  SUBJECTIVE:  CHIEF COMPLAINT:  Patient is resting comfortably. She is a poor historian. 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 Mostly bedbound. Husband at bedside.  REVIEW OF SYSTEMS:  Patient is a poor historian, review of system unobtainable  DRUG ALLERGIES:  No Known Allergies  VITALS:  Blood pressure 122/66, pulse 38, temperature 98.7 F (37.1 C), temperature source Oral, resp. rate 30, height  (1.626 m), weight 77.111 kg (170 lb), SpO2 93 %.  PHYSICAL EXAMINATION:  GENERAL:  82 y.o.-year-old patient lying in the bed with no acute distress.  EYES: Pupils equal, round, reactive to light and accommodation. No scleral icterus. Extraocular muscles intact.  HEENT: Head atraumatic, normocephalic. Oropharynx and nasopharynx clear.  NECK:  Supple, no jugular venous distention. No thyroid enlargement, no tenderness.  LUNGS: Normal breath sounds bilaterally, no wheezing, rales,rhonchi or crepitation. No use of accessory muscles of respiration.  CARDIOVASCULAR: S1, S2 normal. No murmurs, rubs, or gallops.  ABDOMEN: Soft, nontender, nondistended. Bowel sounds present. No organomegaly or mass.  EXTREMITIES: No pedal edema, cyanosis, or clubbing.  NEUROLOGIC: Awake and alert but oriented 1. Bilateral foot drop. PSYCHIATRIC: The patient is alert but oriented 1 SKIN: No obvious rash, lesion, or ulcer.    LABORATORY PANEL:   CBC  Recent Labs Lab 04/11/15 0542  WBC 7.8  HGB 11.7*  HCT 35.2  PLT 193   ------------------------------------------------------------------------------------------------------------------  Chemistries   Recent Labs Lab 04/10/15 1910 04/11/15 0542  NA 138 139  K 2.7* 3.6  CL 106 111  CO2 22 23  GLUCOSE 112* 81  BUN 7 8  CREATININE 0.78 0.63  CALCIUM 8.2* 7.8*  MG 1.7  --    AST 23  --   ALT 17  --   ALKPHOS 116  --   BILITOT 2.3*  --    ------------------------------------------------------------------------------------------------------------------  Cardiac Enzymes  Recent Labs Lab 04/10/15 1910  TROPONINI <0.03   ------------------------------------------------------------------------------------------------------------------  RADIOLOGY:  Dg Chest 1 View  04/10/2015  CLINICAL DATA:  Weakness.  Found unresponsive today. EXAM: CHEST 1 VIEW COMPARISON:  03/14/2015 FINDINGS: VP shunt tubing is again noted in the lower right neck and right chest. Cardiomediastinal silhouette is unchanged. Slight elevation of the right hemidiaphragm is unchanged. No airspace consolidation, edema, pleural effusion, or pneumothorax is identified. Sequelae of prior thoracolumbar posterior fusion are again identified with grossly similar appearance of bilateral interconnecting rod disruption. An old right humeral neck fracture is noted. IMPRESSION: No active disease. Electronically Signed   By: Sebastian Ache M.D.   On: 04/10/2015 19:59   Ct Head Wo Contrast  04/10/2015  CLINICAL DATA:  Altered mental status EXAM: CT HEAD WITHOUT CONTRAST TECHNIQUE: Contiguous axial images were obtained from the base of the skull through the vertex without intravenous contrast. COMPARISON:  03/14/2015 FINDINGS: No intracranial hemorrhage, mass effect or midline shift. Chronic opacification of the left sphenoid sinus again noted. Again noted fluid and opacification of left mastoid air cells. The maxillary sinuses are unremarkable. Again noted sellar region meningioma which is unchanged from prior exam. Stable VP shunt catheter position. There is no intraventricular hemorrhage. Ventricular size is stable from prior exam. Again noted cerebral atrophy and periventricular chronic white matter disease. Stable lacunar infarcts in basal ganglia. Stable patchy subcortical chronic white matter decreased  attenuation. There is improvement in previous  subdural hematomas. The right subdural hematoma has resolved. Only minimal left subdural hematoma with improvement from prior exam measures 3 mm thickness decreased from prior exam. There is no evidence of acute hemorrhage within this chronic appearing hematoma. No definite acute cortical infarction. IMPRESSION: 1. Significant improvement in previous bilateral subdural hematoma. The right subdural hematoma has resolved. Only tiny residual left subdural chronic hematoma measures 3 mm thickness. No evidence of acute hemorrhage or blood products. 2. Again noted VP shunt catheter. Ventricular size is stable from prior exam. 3. Stable atrophy and chronic white matter disease. Stable small lacunar infarcts bilateral basal ganglia. No definite acute cortical infarction 4. Stable meningioma in suprasellar region. Electronically Signed   By: Natasha MeadLiviu  Pop M.D.   On: 04/10/2015 20:03   [place] Abdomen Limited Ruq  04/11/2015  CLINICAL DATA:  82 year old female with a history of elevated bilirubin EXAM: US ABDOMEN LIMITED - RIGHT UPPER QUADRANT COMPARISON:  CT 11/27/2012 FINDINGS: Gallbladder: Partial visualization of the gallbladder secondary to overlying bowel gas, although there is evidence of cholelithiasis. Gallbladder wall measures 2 mm. Sonographic Eulah PontMurphy sign is reported positive. Common bile duct: Diameter: 2 mm -3 mm. Liver: Heterogeneous appearance of the liver parenchyma. IMPRESSION: Cholelithiasis, with sonographic Murphy sign reported as positive, suggesting acute cholecystitis. There is overlying bowel, evident on the current sonographic survey and prior CT, which may be confounding the Murphy sign. If there is any question of an alternative etiology, confirmatory testing with contrast-enhanced CT or nuclear medicine study may be considered. Signed, Yvone NeuJaime S. Loreta AveWagner, DO [facility] Electronically Signed   By: Gilmer MorJaime   Wagner D.O.   On: 04/11/2015 09:19    EKG:   Orders placed or performed during the hospital encounter of 04/10/15  . ED EKG  . ED EKG  . EKG 12-Lead  . EKG 12-Lead    ASSESSMENT AND PLAN:   #Altered mental status-the patient has baseline dementia and 80 years deaf according to the husband with acute cystitis Provide IV Rocephin, hydration with IV fluids follow-up on the urine cultures Poor historian Aspiration and fall precaution.   #Hypokalemia IV and by mouth potassium, follow-up BMP in a.m.    #generalized weakness PT Evaluation.  #Elevated bilirubin. With positive Murphy sign on ultrasound-possible acute cholecystitis Patient is a poor historian. Surgical consult is placed. Nothing by mouth. Check CMP. In a.m.  #History of A. Fib, rate controlled. Hold on Eliquis on tonight until surgical clearance    All the records are reviewed and case discussed with Care Management/Social Workerr. Management plans discussed with the patient, husband and they are in agreement.  CODE STATUS: DO NOT RESUSCITATE  TOTAL TIME TAKING CARE OF THIS PATIENT: 35 minutes.   POSSIBLE D/C IN 2-3DAYS, DEPENDING ON CLINICAL CONDITION.   Ramonita LabGouru, Effie Wahlert M.D on 04/11/2015 at 2:46 PM  Between 7am to 6pm - Pager - [phone] After 6pm go to [email or web address] - password [email or web address] [facility]  [facility]  Office  [phone]  CC: Primary care physician; Vonita MossMark Crissman, MD

## 2015-04-12 DIAGNOSIS — R17 Unspecified jaundice: Secondary | ICD-10-CM | POA: Insufficient documentation

## 2015-04-12 LAB — COMPREHENSIVE METABOLIC PANEL
ALBUMIN: 2.9 g/dL — AB (ref 3.5–5.0)
ALK PHOS: 87 U/L (ref 38–126)
ALT: 13 U/L — ABNORMAL LOW (ref 14–54)
AST: 12 U/L — AB (ref 15–41)
Anion gap: 3 — ABNORMAL LOW (ref 5–15)
BILIRUBIN TOTAL: 1.1 mg/dL (ref 0.3–1.2)
BUN: 8 mg/dL (ref 6–20)
CALCIUM: 7.8 mg/dL — AB (ref 8.9–10.3)
CO2: 20 mmol/L — ABNORMAL LOW (ref 22–32)
Chloride: 113 mmol/L — ABNORMAL HIGH (ref 101–111)
Creatinine, Ser: 0.52 mg/dL (ref 0.44–1.00)
GFR calc Af Amer: >60 mL/min (ref >60)
GLUCOSE: 93 mg/dL (ref 65–99)
POTASSIUM: 4.3 mmol/L (ref 3.5–5.1)
Sodium: 136 mmol/L (ref 135–145)
TOTAL PROTEIN: 5.3 g/dL — AB (ref 6.5–8.1)

## 2015-04-12 LAB — TROPONIN I: Troponin I: <0.03 ng/mL (ref <0.031)

## 2015-04-12 LAB — CBC
HCT: 32.3 % — ABNORMAL LOW (ref 35.0–47.0)
Hemoglobin: 10.7 g/dL — ABNORMAL LOW (ref 12.0–16.0)
MCH: 29.3 pg (ref 26.0–34.0)
MCHC: 33.1 g/dL (ref 32.0–36.0)
MCV: 88.6 fL (ref 80.0–100.0)
PLATELETS: 195 10*3/uL (ref 150–440)
RBC: 3.65 MIL/uL — ABNORMAL LOW (ref 3.80–5.20)
RDW: 16.1 % — AB (ref 11.5–14.5)
WBC: 8 10*3/uL (ref 3.6–11.0)

## 2015-04-12 LAB — PROTIME-INR
INR: 1.45
PROTHROMBIN TIME: 17.7 s — AB (ref 11.4–15.0)

## 2015-04-12 MED ORDER — SODIUM CHLORIDE 0.9 % IV BOLUS (SEPSIS)
500.0000 mL | Freq: Once | INTRAVENOUS | Status: AC
  Administered 2015-04-12: 500 mL via INTRAVENOUS

## 2015-04-12 MED ORDER — APIXABAN 5 MG PO TABS
5.0000 mg | ORAL_TABLET | Freq: Two times a day (BID) | ORAL | Status: DC
  Administered 2015-04-12 – 2015-04-13 (×3): 5 mg via ORAL
  Filled 2015-04-12 (×3): qty 1

## 2015-04-12 NOTE — Progress Notes (Signed)
[facility] Physicians - [place] at [facility]   PATIENT NAME: Natalie Rollins    MR#:  [id number]  DATE OF BIRTH:  08/24/1929  SUBJECTIVE:  CHIEF COMPLAINT:  Patient is resting comfortably. She is a poor historian. She is a 80% deaf according to the husband with underlying dementia.    REVIEW OF SYSTEMS:  Patient is a poor historian, review of system unobtainable  DRUG ALLERGIES:  No Known Allergies  VITALS:  Blood pressure 130/73, pulse 70, temperature 97.8 F (36.6 C), temperature source Oral, resp. rate 18, height  (1.626 m), weight 77.111 kg (170 lb), SpO2 99 %.  PHYSICAL EXAMINATION:  GENERAL:  82 y.o.-year-old patient lying in the bed with no acute distress.  EYES: Pupils equal, round, reactive to light and accommodation. No scleral icterus. Extraocular muscles intact.  HEENT: Head atraumatic, normocephalic. Oropharynx and nasopharynx clear.  NECK:  Supple, no jugular venous distention. No thyroid enlargement, no tenderness.  LUNGS: Normal breath sounds bilaterally, no wheezing, rales,rhonchi or crepitation. No use of accessory muscles of respiration.  CARDIOVASCULAR: S1, S2 normal. No murmurs, rubs, or gallops.  ABDOMEN: Soft, nontender, nondistended. Bowel sounds present. No organomegaly or mass.  EXTREMITIES: No pedal edema, cyanosis, or clubbing.  NEUROLOGIC: Awake and alert but oriented 1. Bilateral foot drop. PSYCHIATRIC: The patient is alert but oriented 1 SKIN: No obvious rash, lesion, or ulcer.    LABORATORY PANEL:   CBC  Recent Labs Lab 04/12/15 0448  WBC 8.0  HGB 10.7*  HCT 32.3*  PLT 195   ------------------------------------------------------------------------------------------------------------------  Chemistries   Recent Labs Lab 04/10/15 1910  04/12/15 0448  NA 138  < > 136  K 2.7*  < > 4.3  CL 106  < > 113*  CO2 22  < > 20*  GLUCOSE 112*  < > 93  BUN 7  < > 8  CREATININE 0.78  < > 0.52  CALCIUM 8.2*  < > 7.8*   MG 1.7  --   --   AST 23  --  12*  ALT 17  --  13*  ALKPHOS 116  --  87  BILITOT 2.3*  --  1.1  < > = values in this interval not displayed. ------------------------------------------------------------------------------------------------------------------  Cardiac Enzymes  Recent Labs Lab 04/12/15 2126  TROPONINI <0.03   ------------------------------------------------------------------------------------------------------------------  RADIOLOGY:  US Abdomen Limited Ruq  04/11/2015  CLINICAL DATA:  82 year old female with a history of elevated bilirubin EXAM: US ABDOMEN LIMITED - RIGHT UPPER QUADRANT COMPARISON:  CT 11/27/2012 FINDINGS: Gallbladder: Partial visualization of the gallbladder secondary to overlying bowel gas, although there is evidence of cholelithiasis. Gallbladder wall measures 2 mm. Sonographic Eulah Pont sign is reported positive. Common bile duct: Diameter: 2 mm -3 mm. Liver: Heterogeneous appearance of the liver parenchyma. IMPRESSION: Cholelithiasis, with sonographic Murphy sign reported as positive, suggesting acute cholecystitis. There is overlying bowel, evident on the current sonographic survey and prior CT, which may be confounding the Murphy sign. If there is any question of an alternative etiology, confirmatory testing with contrast-enhanced CT or nuclear medicine study may be considered. Signed, Yvone Neu. Loreta Ave, DO [facility] Electronically Signed   By: Gilmer Mor D.O.   On: 04/11/2015 09:19    EKG:   Orders placed or performed during the hospital encounter of 04/10/15  . ED EKG  . ED EKG  . EKG 12-Lead  . EKG 12-Lead  . EKG 12-Lead  . EKG 12-Lead    ASSESSMENT AND  PLAN:   #Altered mental status-the patient has baseline dementia and 80% deaf according to the husband with acute cystitis  Provide IV Rocephin, hydration with IV fluids follow-up on the urine cultures Poor historian Aspiration  and fall precaution.   #Hypokalemia IV and by mouth potassium, follow-up BMP in a.m.    #generalized weakness PT Evaluation pending   #Elevated bilirubin. With positive Murphy sign on ultrasound-possible acute cholecystitis Patient is a poor historian.  Surgery is not recommendning any sx as pt is asymtomatic  #History of A. Fib, rate controlled. Hold on Eliquis on tonight until surgical clearance    All the records are reviewed and case discussed with Care Management/Social Workerr. Management plans discussed with the patient, husband and they are in agreement.  CODE STATUS: DO NOT RESUSCITATE  TOTAL TIME TAKING CARE OF THIS PATIENT: 35 minutes.   POSSIBLE D/C IN 2-3DAYS, DEPENDING ON CLINICAL CONDITION.   Ramonita LabGouru, Colbi Schiltz M.D on 04/12/2015 at 10:15 PM  Between 7am to 6pm - Pager - [phone] After 6pm go to [email or web address] - password [email or web address] [facility]  [facility]  Office  [phone]  CC: Primary care physician; Vonita MossMark Crissman, MD

## 2015-04-12 NOTE — Progress Notes (Signed)
Notified Dr Imogene Burnhen that pt voided. Bladder scan results post void 69ml. Also notified MD that pt c/o chest pain this evening, percocet given with good effect. Troponin ordered and is negative. Notified of 12 lead EKG results.

## 2015-04-12 NOTE — Progress Notes (Signed)
Paged Dr Amado CoeGouru of pt c/o R/mid chest pain while working with PT. Pt anxious at time. VSS. Xanax given. No new orders.

## 2015-04-12 NOTE — Evaluation (Signed)
Physical Therapy Evaluation Patient Details Name: Natalie Rollins MRN: [id number] DOB: Apr 26, 1929 Today's Date: 04/12/2015   History of Present Illness  82 yo female with onset of hypokalemia has recent history of SDH, resolving, has broken rods in thoraco lumbar spine, has meningioma with VP shunt and is semi verbal with HOH and dementia.  PMHx:  PAF, UTI, pituitary mass  Clinical Impression  Noted a struggle for pt to get moving due to extensive pain in her spine and now chest pain complaints with attempts to move.  Nursing reports multiple such complaints with no observed vitals or other changes that indicate a cardiac issue.  Her plan is to start gentle mobilization to transition to SNF when MD will allow.    Follow Up Recommendations SNF    Equipment Recommendations  None recommended by PT (await disposition of SNF)    Recommendations for Other Services Rehab consult     Precautions / Restrictions Precautions Precautions: Fall (telemetry) Restrictions Weight Bearing Restrictions: No      Mobility  Bed Mobility Overal bed mobility: Needs Assistance;+2 for physical assistance;+ 2 for safety/equipment Bed Mobility: Supine to Sit (attempted)     Supine to sit: Total assist (partial movement due to pt being resistant)        Transfers                 General transfer comment: could not get her to transfer due to chest pain  Ambulation/Gait             General Gait Details: none due to chest pain  Stairs            Wheelchair Mobility    Modified Rankin (Stroke Patients Only) Modified Rankin (Stroke Patients Only) Pre-Morbid Rankin Score: Moderately severe disability Modified Rankin: Severe disability     Balance Overall balance assessment: History of Falls                                           Pertinent Vitals/Pain Pain Assessment: Faces Pain Score: 8  Faces Pain Scale: Hurts whole lot Pain Location: chest  during attempts to move her Pain Intervention(s): Repositioned;Monitored during session;Limited activity within patient's tolerance;Other (comment) (Nursing in to assess due to chest pain)    Home Living Family/patient expects to be discharged to:: Private residence Living Arrangements: Spouse/significant other Available Help at Discharge: Family;Available 24 hours/day             Additional Comments: husband not available to obtain information about her home situation    Prior Function Level of Independence: Needs assistance   Gait / Transfers Assistance Needed: was able to walk at some point with RW  ADL's / Homemaking Assistance Needed: husband cares for pt        Hand Dominance        Extremity/Trunk Assessment   Upper Extremity Assessment: Generalized weakness           Lower Extremity Assessment: Generalized weakness      Cervical / Trunk Assessment: Kyphotic  Communication   Communication: HOH;Expressive difficulties;Other (comment) (semi verbal)  Cognition Arousal/Alertness: Lethargic Behavior During Therapy: Flat affect Overall Cognitive Status: History of cognitive impairments - at baseline       Memory: Decreased recall of precautions;Decreased short-term memory              General Comments General comments (skin integrity,  edema, etc.): Pt is able to move with assistance but resistant due to pain in spine and her chest, which since vitals are fine may be related to her spine    Exercises        Assessment/Plan    PT Assessment Patient needs continued PT services  PT Diagnosis Generalized weakness;Acute pain   PT Problem List Decreased strength;Decreased range of motion;Decreased activity tolerance;Decreased balance;Decreased mobility;Decreased coordination;Decreased cognition;Decreased safety awareness;Cardiopulmonary status limiting activity;Obesity;Pain  PT Treatment Interventions DME instruction;Gait training;Functional mobility  training;Therapeutic activities;Therapeutic exercise;Balance training;Neuromuscular re-education;Cognitive remediation;Patient/family education   PT Goals (Current goals can be found in the Care Plan section) Acute Rehab PT Goals Patient Stated Goal: none stated PT Goal Formulation: Patient unable to participate in goal setting Time For Goal Achievement: 04/26/15 Potential to Achieve Goals: Fair    Frequency Min 2X/week   Barriers to discharge Decreased caregiver support (husband cannot move her unless he has a power lift)      Co-evaluation               End of Session Equipment Utilized During Treatment: Other (comment) (Pt not wearing O2 and was 95% saturation) Activity Tolerance: Patient limited by pain;Patient limited by fatigue Patient left: in bed;with call bell/phone within reach;with nursing/sitter in room;with bed alarm set Nurse Communication: Mobility status;Other (comment) (called MD to report results of PT eval)         Time: 4098-1191 PT Time Calculation (min) (ACUTE ONLY): 39 min   Charges:     PT Treatments $Therapeutic Activity: 23-37 mins   PT G Codes:        Ivar Drape 05/12/2015, 12:58 PM   Samul Dada, PT MS Acute Rehab Dept. Number: [facility] [id number] and [facility] [phone]

## 2015-04-12 NOTE — Progress Notes (Signed)
Notified Dr Amado CoeGouru of pt c/o mid chest pain during dinner. Pt continues to eat while c/o pain. BP 157/83, HR 84, Resp 24, O2 sats 92% on room air. 2L O2 applied for comfort. Percocet given for pain. MD verbalized to order troponin now and repeat in 4hrs and 12 lead EKG.

## 2015-04-12 NOTE — Progress Notes (Signed)
Notified Dr Amado CoeGouru that pt did not void during this shift. Bladder scan results 169ml. VSS. MD verbalized to give NS 500ml bolus and to re-scan bladder at 1800.

## 2015-04-12 NOTE — Progress Notes (Signed)
Patient revisited this morning. She continues to say she does not have any abdominal pain.  LFTs have normalized.  No evidence of cholecystitis. Please call again if we can be of any further assistance with this patient.  Ricarda Frameharles Duard Spiewak, MD FACS General Surgeon [facility]

## 2015-04-13 LAB — URINE CULTURE: Culture: NO GROWTH

## 2015-04-13 MED ORDER — ACETAMINOPHEN 325 MG PO TABS: 650.0000 mg | ORAL_TABLET | Freq: Four times a day (QID) | ORAL | Status: AC | PRN

## 2015-04-13 MED ORDER — ALPRAZOLAM 0.25 MG PO TABS: 0.2500 mg | ORAL_TABLET | Freq: Two times a day (BID) | ORAL | Status: DC | PRN

## 2015-04-13 NOTE — Care Management (Signed)
I have notified Barbara CowerJason with [facility] of patient discharge to home today. No further RNCM needs. Case closed.

## 2015-04-13 NOTE — Progress Notes (Signed)
Discharge instructions and prescriptions given to husband with verbalized understanding.  Patient taken to visitors entrance via wheelchair by orderly and nurse.  Patient's husband and orderly assisted patient in to personal vehicle to be taken home.

## 2015-04-13 NOTE — Care Management Note (Signed)
Case Management Note  Patient Details  Name: Natalie Rollins MRN: [id number] Date of Birth: [birth year]  Subjective/Objective:                   Met with patient to discuss discharge planning. She asked that I call her husband [name] which I did.  She has a rolling walker in storage. [name] states that he would like to use [facility] for home health. Her PCP is Dr. Arnaldo Natal. She is able to obtain her meds without difficulty and husband can provide transportation. Action/Plan: List of home health agencies left with patient. Referral to [facility].  Expected Discharge Date:                  Expected Discharge Plan:     In-House Referral:     Discharge planning Services  CM Consult  Post Acute Care Choice:  Home Health Choice offered to:  Spouse, Patient  DME Arranged:    DME Agency:     HH Arranged:    [place] Agency:  [facility]  Status of Service:  Completed, signed off  Medicare Important Message Given:    Date Medicare IM Given:    Medicare IM give by:    Date Additional Medicare IM Given:    Additional Medicare Important Message give by:     If discussed at [place] of Stay Meetings, dates discussed:    Additional Comments:  Marshell Garfinkel, RN 04/13/2015, 11:03 AM

## 2015-04-13 NOTE — Care Management Important Message (Signed)
Important Message  Patient Details  Name: Fonnie Muancy Stevens Such MRN: [id number] Date of Birth: 01/06/1930   Medicare Important Message Given:  Yes    Collie SiadAngela Nicholson Starace, RN 04/13/2015, 1:50 PM

## 2015-04-13 NOTE — Progress Notes (Signed)
Iv e requested RN to wean off pts o2 as tolerated. I have discussed with patient's husband who is requesting to take her home with home health PT . He is not considering skilled nursing facility at this time

## 2015-04-14 ENCOUNTER — Telehealth: Payer: Self-pay | Admitting: Family Medicine

## 2015-04-14 NOTE — Telephone Encounter (Signed)
pts husband called before lunch but phone died and he would like a call back to continue the discussion about his wife's x ray results

## 2015-04-16 ENCOUNTER — Emergency Department: Payer: Medicare Other

## 2015-04-16 ENCOUNTER — Encounter: Payer: Self-pay | Admitting: Emergency Medicine

## 2015-04-16 ENCOUNTER — Inpatient Hospital Stay
Admission: EM | Admit: 2015-04-16 | Discharge: 2015-04-20 | DRG: 308 | Disposition: A | Discharge status: Skilled Nursing Facility | Payer: Medicare Other | Attending: Internal Medicine | Admitting: Internal Medicine

## 2015-04-16 DIAGNOSIS — I951 Orthostatic hypotension: Secondary | ICD-10-CM

## 2015-04-16 DIAGNOSIS — Z66 Do not resuscitate: Secondary | ICD-10-CM | POA: Diagnosis present

## 2015-04-16 DIAGNOSIS — R4182 Altered mental status, unspecified: Secondary | ICD-10-CM | POA: Diagnosis present

## 2015-04-16 DIAGNOSIS — Z87440 Personal history of urinary (tract) infections: Secondary | ICD-10-CM

## 2015-04-16 DIAGNOSIS — Z9889 Other specified postprocedural states: Secondary | ICD-10-CM

## 2015-04-16 DIAGNOSIS — E230 Hypopituitarism: Secondary | ICD-10-CM | POA: Diagnosis present

## 2015-04-16 DIAGNOSIS — I248 Other forms of acute ischemic heart disease: Secondary | ICD-10-CM | POA: Diagnosis present

## 2015-04-16 DIAGNOSIS — Z86011 Personal history of benign neoplasm of the brain: Secondary | ICD-10-CM

## 2015-04-16 DIAGNOSIS — I482 Chronic atrial fibrillation: Secondary | ICD-10-CM | POA: Diagnosis present

## 2015-04-16 DIAGNOSIS — Z79899 Other long term (current) drug therapy: Secondary | ICD-10-CM

## 2015-04-16 DIAGNOSIS — F0390 Unspecified dementia without behavioral disturbance: Secondary | ICD-10-CM | POA: Diagnosis present

## 2015-04-16 DIAGNOSIS — E785 Hyperlipidemia, unspecified: Secondary | ICD-10-CM | POA: Diagnosis present

## 2015-04-16 DIAGNOSIS — Z833 Family history of diabetes mellitus: Secondary | ICD-10-CM

## 2015-04-16 DIAGNOSIS — I480 Paroxysmal atrial fibrillation: Secondary | ICD-10-CM | POA: Diagnosis not present

## 2015-04-16 DIAGNOSIS — R7989 Other specified abnormal findings of blood chemistry: Secondary | ICD-10-CM | POA: Diagnosis present

## 2015-04-16 DIAGNOSIS — M1990 Unspecified osteoarthritis, unspecified site: Secondary | ICD-10-CM | POA: Diagnosis present

## 2015-04-16 DIAGNOSIS — E039 Hypothyroidism, unspecified: Secondary | ICD-10-CM | POA: Diagnosis present

## 2015-04-16 DIAGNOSIS — Z981 Arthrodesis status: Secondary | ICD-10-CM

## 2015-04-16 DIAGNOSIS — R778 Other specified abnormalities of plasma proteins: Secondary | ICD-10-CM | POA: Diagnosis present

## 2015-04-16 DIAGNOSIS — Z7901 Long term (current) use of anticoagulants: Secondary | ICD-10-CM

## 2015-04-16 DIAGNOSIS — Z8673 Personal history of transient ischemic attack (TIA), and cerebral infarction without residual deficits: Secondary | ICD-10-CM

## 2015-04-16 DIAGNOSIS — I4891 Unspecified atrial fibrillation: Secondary | ICD-10-CM | POA: Diagnosis present

## 2015-04-16 DIAGNOSIS — E860 Dehydration: Secondary | ICD-10-CM | POA: Diagnosis present

## 2015-04-16 DIAGNOSIS — G9340 Encephalopathy, unspecified: Secondary | ICD-10-CM | POA: Diagnosis present

## 2015-04-16 LAB — CBC WITH DIFFERENTIAL/PLATELET
Basophils Absolute: 0.1 10*3/uL (ref 0–0.1)
Basophils Relative: 0 %
EOS ABS: 0.1 10*3/uL (ref 0–0.7)
EOS PCT: 1 %
HCT: 39.3 % (ref 35.0–47.0)
Hemoglobin: 12.8 g/dL (ref 12.0–16.0)
LYMPHS ABS: 1.9 10*3/uL (ref 1.0–3.6)
LYMPHS PCT: 15 %
MCH: 29.4 pg (ref 26.0–34.0)
MCHC: 32.6 g/dL (ref 32.0–36.0)
MCV: 90 fL (ref 80.0–100.0)
MONO ABS: 1.2 10*3/uL — AB (ref 0.2–0.9)
Monocytes Relative: 9 %
Neutro Abs: 9.6 10*3/uL — ABNORMAL HIGH (ref 1.4–6.5)
Neutrophils Relative %: 75 %
PLATELETS: 303 10*3/uL (ref 150–440)
RBC: 4.37 MIL/uL (ref 3.80–5.20)
RDW: 16.4 % — AB (ref 11.5–14.5)
WBC: 12.8 10*3/uL — AB (ref 3.6–11.0)

## 2015-04-16 LAB — URINALYSIS COMPLETE WITH MICROSCOPIC (ARMC ONLY)
BILIRUBIN URINE: NEGATIVE
GLUCOSE, UA: NEGATIVE mg/dL
Hgb urine dipstick: NEGATIVE
Leukocytes, UA: NEGATIVE
Nitrite: NEGATIVE
Protein, ur: 30 mg/dL — AB
Specific Gravity, Urine: 1.013 (ref 1.005–1.030)
pH: 6 (ref 5.0–8.0)

## 2015-04-16 LAB — COMPREHENSIVE METABOLIC PANEL
ALBUMIN: 3.4 g/dL — AB (ref 3.5–5.0)
ALK PHOS: 116 U/L (ref 38–126)
ALT: 18 U/L (ref 14–54)
AST: 32 U/L (ref 15–41)
Anion gap: 15 (ref 5–15)
BILIRUBIN TOTAL: 1.3 mg/dL — AB (ref 0.3–1.2)
BUN: 7 mg/dL (ref 6–20)
CALCIUM: 8.6 mg/dL — AB (ref 8.9–10.3)
CO2: 18 mmol/L — ABNORMAL LOW (ref 22–32)
Chloride: 103 mmol/L (ref 101–111)
Creatinine, Ser: 1.33 mg/dL — ABNORMAL HIGH (ref 0.44–1.00)
GFR calc non Af Amer: 36 mL/min — ABNORMAL LOW (ref >60)
GFR, EST AFRICAN AMERICAN: 42 mL/min — AB (ref >60)
Glucose, Bld: 185 mg/dL — ABNORMAL HIGH (ref 65–99)
POTASSIUM: 3.7 mmol/L (ref 3.5–5.1)
Sodium: 136 mmol/L (ref 135–145)
TOTAL PROTEIN: 6.1 g/dL — AB (ref 6.5–8.1)

## 2015-04-16 LAB — TROPONIN I: TROPONIN I: 0.06 ng/mL — AB (ref <0.031)

## 2015-04-16 MED ORDER — DILTIAZEM HCL 25 MG/5ML IV SOLN
10.0000 mg | Freq: Once | INTRAVENOUS | Status: AC
  Administered 2015-04-16: 10 mg via INTRAVENOUS
  Filled 2015-04-16: qty 5

## 2015-04-16 MED ORDER — SODIUM CHLORIDE 0.9 % IV BOLUS (SEPSIS)
1000.0000 mL | Freq: Once | INTRAVENOUS | Status: AC
  Administered 2015-04-16: 1000 mL via INTRAVENOUS

## 2015-04-16 NOTE — ED Notes (Signed)
Pt comes into the ED via EMS from home c/o altered mental status, decreased LOC, and atrial fibrillation uncontrolled.  Patient respond to voice and stimulation.  Initial pressure 76/41 and increased to 126/66, CBG 192, 12 lead no elevation and only a-fib.  A-fib started at 190 and given 20 mg cardizem and resulted in a-fib of 120's.  Patient also given 250 NaCl.

## 2015-04-16 NOTE — Discharge Summary (Signed)
[facility] - [place] at [facility]   PATIENT NAME: Natalie Rollins    MR#:  [id number]  DATE OF BIRTH:  01-Nov-1929  DATE OF ADMISSION:  04/10/2015 ADMITTING PHYSICIAN: Shaune Pollack, MD  DATE OF DISCHARGE: 04/13/2015  2:34 PM  PRIMARY CARE PHYSICIAN: Vonita Moss, MD    ADMISSION DIAGNOSIS:  Hypokalemia [E87.6] Weakness [R53.1] Altered mental status, unspecified altered mental status type [R41.82]  DISCHARGE DIAGNOSIS:  Principal Problem:   Hypokalemia Active Problems:   Elevated bilirubin   SECONDARY DIAGNOSIS:   Past Medical History  Diagnosis Date  . PAF (paroxysmal atrial fibrillation) (HCC)     a. unknown if PAF vs persistent at this time given her travel history; b. on ; c. history of post op a-fib 07/2010  . Pituitary mass (HCC)     a. 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 History of stroke x 2    a. left internal capsule CVA 07/2010; b. right stroke in right basal ganglia 2013  . HLD (hyperlipidemia)   . Arthritis     a. s/p lumbar fusion c/b broken hardware w/ removal of the fusion & subsequent revision most recently 07/2010  . Recurrent UTI   . Hydrocephalus     has a shunt placed   . Stroke (HCC)     x 2  . Dementia   . Allergy     HOSPITAL COURSE:   #Altered mental status-the patient has baseline dementia and 80% deaf according to the husband and dehydration. Patient is back to her baseline with IV fluids   # A cystitis ruled out with negative urine culture Provided IV Rocephin, hydration with IV fluids . Discontinued antibiotics .patient is a Poor historian Aspiration and fall precaution.   #Hypokalemia IV and by mouth potassium, follow-up BMP revealed potassium at 4.3   #generalized weakness PT Evaluation recommended skilled nursing facility. Husband prefers taking care of her at home with home health, discussed with case management to arrange home health per husband's  wishes  #Elevated bilirubin. With positive Murphy sign on ultrasound-possible acute cholecystitis Patient is a poor historian. Surgery is not recommendning any sx as pt is asymtomatic  #History of A. Fib, rate controlled.  Eliquis   DISCHARGE CONDITIONS:   Fair  CONSULTS OBTAINED:      PROCEDURES none  DRUG ALLERGIES:  No Known Allergies  DISCHARGE MEDICATIONS:   Discharge Medication List as of 04/13/2015  1:39 PM    START taking these medications   Details  acetaminophen (TYLENOL) 325 MG tablet Take 2 tablets (650 mg total) by mouth every 6 (six) hours as needed for mild pain (or Fever >/= 101)., Starting 04/13/2015, Until Discontinued, OTC    ALPRAZolam (XANAX) 0.25 MG tablet Take 1 tablet (0.25 mg total) by mouth 2 (two) times daily as needed for anxiety., Starting 04/13/2015, Until Discontinued, Print      CONTINUE these medications which have NOT CHANGED   Details  apixaban (ELIQUIS) 5 MG TABS tablet Take 5 mg by mouth 2 (two) times daily., Until Discontinued, Historical Med    atorvastatin (LIPITOR) 10 MG tablet Take 10 mg by mouth at bedtime., Until Discontinued, Historical Med    Calcium-Magnesium-Zinc 333-133-5 MG TABS Take 1 tablet by mouth daily. , Until Discontinued, Historical Med    cholecalciferol (VITAMIN D) 1000 units tablet Take 2,000 Units by mouth daily., Until Discontinued, Historical Med    desmopressin (DDAVP) 0.1 MG tablet Take 0.1 mg by  mouth 2 (two) times daily., Until Discontinued, Historical Med    diltiazem (CARDIZEM CD) 120 MG 24 hr capsule Take 120 mg by mouth daily., Until Discontinued, Historical Med    glucosamine-chondroitin 500-400 MG tablet Take 1 tablet by mouth 2 (two) times daily., Until Discontinued, Historical Med    hydrocortisone (CORTEF) 10 MG tablet Take 20 mg by mouth 3 (three) times daily. , Until Discontinued, Historical Med    levothyroxine (SYNTHROID, LEVOTHROID) 88 MCG tablet Take 88 mcg by mouth daily before  breakfast., Until Discontinued, Historical Med    Multiple Vitamin (MULTIVITAMIN WITH MINERALS) TABS tablet Take 1 tablet by mouth daily., Until Discontinued, Historical Med    oxyCODONE-acetaminophen (PERCOCET/ROXICET) 5-325 MG per tablet Take 1 tablet by mouth every 6 (six) hours as needed for severe pain. , Until Discontinued, Historical Med    vitamin C (ASCORBIC ACID) 500 MG tablet Take 1,000 mg by mouth daily., Until Discontinued, Historical Med    vitamin E 400 UNIT capsule Take 400 Units by mouth daily. , Until Discontinued, Historical Med      STOP taking these medications     amoxicillin (AMOXIL) 500 MG capsule          DISCHARGE INSTRUCTIONS:  Activity as tolerated per PT recommendations Diet cardiac Follow-up with primary care physician and cardiology as recommended   DIET:  Cardiac diet  DISCHARGE CONDITION:  Fair  ACTIVITY:  Activity as tolerated  OXYGEN:  Home Oxygen: No.   Oxygen Delivery: room air  DISCHARGE LOCATION:  home   If you experience worsening of your admission symptoms, develop shortness of breath, life threatening emergency, suicidal or homicidal thoughts you must seek medical attention immediately by calling 911 or calling your MD immediately  if symptoms less severe.  You Must read complete instructions/literature along with all the possible adverse reactions/side effects for all the Medicines you take and that have been prescribed to you. Take any new Medicines after you have completely understood and accpet all the possible adverse reactions/side effects.   Please note  You were cared for by a hospitalist during your hospital stay. If you have any questions about your discharge medications or the care you received while you were in the hospital after you are discharged, you can call the unit and asked to speak with the hospitalist on call if the hospitalist that took care of you is not available. Once you are discharged, your primary  care physician will handle any further medical issues. Please note that NO REFILLS for any discharge medications will be authorized once you are discharged, as it is imperative that you return to your primary care physician (or establish a relationship with a primary care physician if you do not have one) for your aftercare needs so that they can reassess your need for medications and monitor your lab values.     Today  Chief Complaint  Patient presents with  . Altered Mental Status   Patient is more awake and alert. Poor historian  ROS:  Patient is a poor historian which is her baseline   VITAL SIGNS:  Blood pressure 140/64, pulse 76, temperature 97.6 F (36.4 C), temperature source Oral, resp. rate 19, height 5\' 4"  (1.626 m), weight 77.111 kg (170 lb), SpO2 96 %.  I/O:  No intake or output data in the 24 hours ending 04/16/15 1119  PHYSICAL EXAMINATION:  GENERAL:  82 y.o.-year-old patient lying in the bed with no acute distress.  EYES: Pupils equal, round, reactive to light  and accommodation. No scleral icterus. Extraocular muscles intact.  HEENT: Head atraumatic, normocephalic. Oropharynx and nasopharynx clear.  NECK:  Supple, no jugular venous distention. No thyroid enlargement, no tenderness.  LUNGS: Normal breath sounds bilaterally, no wheezing, rales,rhonchi or crepitation. No use of accessory muscles of respiration.  CARDIOVASCULAR: S1, S2 normal. No murmurs, rubs, or gallops.  ABDOMEN: Soft, non-tender, non-distended. Bowel sounds present. No organomegaly or mass.  EXTREMITIES: No pedal edema, cyanosis, or clubbing.  NEUROLOGIC: Awake and alert and oriented 1 PSYCHIATRIC: The patient is alert and oriented x 1 SKIN: No obvious rash, lesion, or ulcer.   DATA REVIEW:   CBC  Recent Labs Lab 04/12/15 0448  WBC 8.0  HGB 10.7*  HCT 32.3*  PLT 195    Chemistries   Recent Labs Lab 04/10/15 1910  04/12/15 0448  NA 138  < > 136  K 2.7*  < > 4.3  CL 106  < >  113*  CO2 22  < > 20*  GLUCOSE 112*  < > 93  BUN 7  < > 8  CREATININE 0.78  < > 0.52  CALCIUM 8.2*  < > 7.8*  MG 1.7  --   --   AST 23  --  12*  ALT 17  --  13*  ALKPHOS 116  --  87  BILITOT 2.3*  --  1.1  < > = values in this interval not displayed.  Cardiac Enzymes  Recent Labs Lab 04/12/15 2126  TROPONINI <0.03    Microbiology Results  Results for orders placed or performed during the hospital encounter of 04/10/15  Urine culture     Status: None   Collection Time: 04/11/15  8:22 AM  Result Value Ref Range Status   Specimen Description URINE, CLEAN CATCH  Final   Special Requests NONE  Final   Culture NO GROWTH 2 DAYS  Final   Report Status 04/13/2015 FINAL  Final    RADIOLOGY:  No results found.  EKG:   Orders placed or performed during the hospital encounter of 04/10/15  . ED EKG  . ED EKG  . EKG 12-Lead  . EKG 12-Lead  . EKG 12-Lead  . EKG 12-Lead      Management plans discussed with the patient, family and they are in agreement.  CODE STATUS:  Code Status History    Date Active Date Inactive Code Status Order ID Comments User Context   04/10/2015 10:35 PM 04/13/2015  5:34 PM DNR [id number]  Shaune Pollack, MD Inpatient   09/07/2014  1:04 AM 09/07/2014  5:15 PM Full Code [id number]  Oralia Manis, MD Inpatient    Questions for Most Recent Historical Code Status (Order [id number])    Question Answer Comment   In the event of cardiac or respiratory ARREST Do not call a "code blue"    In the event of cardiac or respiratory ARREST Do not perform Intubation, CPR, defibrillation or ACLS    In the event of cardiac or respiratory ARREST Use medication by any route, position, wound care, and other measures to relive pain and suffering. May use oxygen, suction and manual treatment of airway obstruction as needed for comfort.     Advance Directive Documentation        Most Recent Value   Type of Advance Directive  Healthcare Power of Attorney   Pre-existing out of  facility DNR order (yellow form or pink MOST form)     "MOST" Form in Place?  TOTAL TIME TAKING CARE OF THIS PATIENT: 45  minutes.    @[facility] @  on 04/16/2015 at 11:19 AM  Between 7am to 6pm - Pager - [phone]  After 6pm go to [email or web address] - password [email or web address] [facility]  [facility]  Office  [phone]  CC: Primary care physician; Vonita MossMark Crissman, MD

## 2015-04-16 NOTE — ED Notes (Signed)
Patient transported to CT 

## 2015-04-16 NOTE — ED Provider Notes (Signed)
[facility] Emergency Department Provider Note  ____________________________________________    I have reviewed the triage vital signs and the nursing notes.   HISTORY  Chief Complaint Altered Mental Status and Atrial Fibrillation  Patient unable to provide history due to altered mental status  HPI Natalie Rollins is a 82 y.o. female presents with altered mental status. EMS reports she does not seem to respond except to significant stimulation. They report her initial blood pressure was 76/41 but normalized rapidly. They also noted the patient to be in A. fib with RVR. EMS did give 20 mg of Cardizem and 250 cc of normal saline. Review of records demonstrates similar presentation one week ago     Past Medical History  Diagnosis Date  . PAF (paroxysmal atrial fibrillation) (HCC)     a. unknown if PAF vs persistent at this time given her travel history; b. on ; c. history of post op a-fib 07/2010  . Pituitary mass (HCC)     a. 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 History of stroke x 2    a. left internal capsule CVA 07/2010; b. right stroke in right basal ganglia 2013  . HLD (hyperlipidemia)   . Arthritis     a. s/p lumbar fusion c/b broken hardware w/ removal of the fusion & subsequent revision most recently 07/2010  . Recurrent UTI   . Hydrocephalus     has a shunt placed   . Stroke (HCC)     x 2  . Dementia   . Allergy     Patient Active Problem List   Diagnosis Date Noted  . Elevated bilirubin   . Hypokalemia 04/10/2015  . Senile purpura (HCC) 03/24/2015  . Right hip pain 11/10/2014  . Chronic pain 09/15/2014  . Allergic rhinitis 09/15/2014  . Atrial fibrillation with RVR (HCC) 09/06/2014  . HLD (hyperlipidemia) 09/06/2014  . Hypothyroidism (acquired) 09/06/2014  . UTI (lower urinary tract infection) 09/06/2014  . Panhypopituitarism (HCC) 09/06/2014  . Atrial fibrillation, unspecified  06/19/2014  . History of stroke 06/19/2014  . Nonverbal 06/19/2014  . Altered mental status 06/19/2014  . History of frequent urinary tract infections 06/19/2014  . AD (Alzheimer's disease) 02/25/2014  . Acquired hypothyroidism 02/25/2014  . 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 Hypotension, iatrogenic 02/25/2014    Past Surgical History  Procedure Laterality Date  . Spine surgery      metal rods places   . Patella fracture surgery      left   . Tibia fracture surgery      right  . Humerus surgery      right    Current Outpatient Rx  Name  Route  Sig  Dispense  Refill  . acetaminophen (TYLENOL) 325 MG tablet   Oral   Take 2 tablets (650 mg total) by mouth every 6 (six) hours as needed for mild pain (or Fever >/= 101).         Marland Kitchen. ALPRAZolam (XANAX) 0.25 MG tablet   Oral   Take 1 tablet (0.25 mg total) by mouth 2 (two) times daily as needed for anxiety.   30 tablet   0   . apixaban (ELIQUIS) 5 MG TABS tablet   Oral   Take 5 mg by mouth 2 (two) times daily.         Marland Kitchen. atorvastatin (LIPITOR) 10 MG tablet   Oral   Take 10 mg by mouth  at bedtime.         . Calcium-Magnesium-Zinc 333-133-5 MG TABS   Oral   Take 1 tablet by mouth daily.          . cholecalciferol (VITAMIN D) 1000 units tablet   Oral   Take 2,000 Units by mouth daily.         Marland Kitchen desmopressin (DDAVP) 0.1 MG tablet   Oral   Take 0.1 mg by mouth 2 (two) times daily.         Marland Kitchen diltiazem (CARDIZEM CD) 120 MG 24 hr capsule   Oral   Take 120 mg by mouth daily.         Marland Kitchen glucosamine-chondroitin 500-400 MG tablet   Oral   Take 1 tablet by mouth 2 (two) times daily.         . hydrocortisone (CORTEF) 10 MG tablet   Oral   Take 20 mg by mouth 3 (three) times daily.       1   . levothyroxine (SYNTHROID, LEVOTHROID) 88 MCG tablet   Oral   Take 88 mcg by mouth daily before breakfast.         . Multiple Vitamin (MULTIVITAMIN WITH MINERALS) TABS tablet    Oral   Take 1 tablet by mouth daily.         Marland Kitchen oxyCODONE-acetaminophen (PERCOCET/ROXICET) 5-325 MG per tablet   Oral   Take 1 tablet by mouth every 6 (six) hours as needed for severe pain.          . vitamin C (ASCORBIC ACID) 500 MG tablet   Oral   Take 1,000 mg by mouth daily.         . vitamin E 400 UNIT capsule   Oral   Take 400 Units by mouth daily.            Allergies Review of patient's allergies indicates no known allergies.  Family History  Problem Relation Age of Onset  . Diabetes Son   . Kidney Stones Son     Social History Social History  Substance Use Topics  . Smoking status: Never Smoker   . Smokeless tobacco: Never Used  . Alcohol Use: Yes     Comment: a glass of wine on occasion    Level V caveat: Unable to obtain Review of Systems due to altered mental status     ____________________________________________   PHYSICAL EXAM:  VITAL SIGNS: ED Triage Vitals  Enc Vitals Group     BP 04/16/15 1908 94/56 mmHg     Pulse Rate 04/16/15 1908 76     Resp 04/16/15 1908 18     Temp 04/16/15 1908 97.4 F (36.3 C)     Temp Source 04/16/15 1908 Axillary     SpO2 04/16/15 1908 100 %     Weight 04/16/15 1908 175 lb 8 oz (79.606 kg)     Height --      Head Cir --      Peak Flow --      Pain Score --      Pain Loc --      Pain Edu? --      Excl. in GC? --     Constitutional: Responsive only to stimulation, eyes closed Eyes: Conjunctivae are normal. No erythema or injection, PERRLA ENT   Head: Normocephalic and atraumatic.   Mouth/Throat: Mucous membranes are moist. Cardiovascular: Tachycardia, irregularly irregular rhythm. Normal and symmetric distal pulses are present in the upper extremities.   Respiratory:  Normal respiratory effort without tachypnea nor retractions. Breath sounds are clear and equal bilaterally.  Gastrointestinal: Soft and non-tender in all quadrants. No distention. There is no CVA tenderness. Genitourinary:  deferred Musculoskeletal: Nontender with normal range of motion in all extremities. No lower extremity tenderness nor edema. Neurologic:  Patient appears to move all extremities normally Skin:  Skin is warm, dry and intact. No rash noted. Psychiatric: Unable to examine  ____________________________________________    LABS (pertinent positives/negatives)  Labs Reviewed  COMPREHENSIVE METABOLIC PANEL - Abnormal; Notable for the following:    CO2 18 (*)    Glucose, Bld 185 (*)    Creatinine, Ser 1.33 (*)    Calcium 8.6 (*)    Total Protein 6.1 (*)    Albumin 3.4 (*)    Total Bilirubin 1.3 (*)    GFR calc non Af Amer 36 (*)    GFR calc Af Amer 42 (*)    All other components within normal limits  CBC WITH DIFFERENTIAL/PLATELET - Abnormal; Notable for the following:    WBC 12.8 (*)    RDW 16.4 (*)    Neutro Abs 9.6 (*)    Monocytes Absolute 1.2 (*)    All other components within normal limits  TROPONIN I - Abnormal; Notable for the following:    Troponin I 0.06 (*)    All other components within normal limits  URINALYSIS COMPLETEWITH MICROSCOPIC ([facility] ONLY)  CBG MONITORING, ED    ____________________________________________   EKG  ED ECG REPORT I, Jene Every, the attending physician, personally viewed and interpreted this ECG.   Date: 04/16/2015  EKG Time: 7:07 PM  Rate: 122  Rhythm: atrial fibrillation, rate 122  Axis: Normal  Intervals:Prolonged QT  ST&T Change: Nonspecific   ____________________________________________    RADIOLOGY  CT head unremarkable Chest x-ray unremarkable  ____________________________________________   PROCEDURES  Procedure(s) performed: none  Critical Care performed: yes  CRITICAL CARE Performed by: Jene Every   Total critical care time: 30 minutes  Critical care time was exclusive of separately billable procedures and treating other patients.  Critical care was necessary to treat or prevent imminent or  life-threatening deterioration.  Critical care was time spent personally by me on the following activities: development of treatment plan with patient and/or surrogate as well as nursing, discussions with consultants, evaluation of patient's response to treatment, examination of patient, obtaining history from patient or surrogate, ordering and performing treatments and interventions, ordering and review of laboratory studies, ordering and review of radiographic studies, pulse oximetry and re-evaluation of patient's condition.  ____________________________________________   INITIAL IMPRESSION / ASSESSMENT AND PLAN / ED COURSE  Pertinent labs & imaging results that were available during my care of the patient were reviewed by me and considered in my medical decision making (see chart for details).  Patient presents with altered mental status. This appears to be similar to prior hospitalization. She does have A. fib with RVR at this time. We will give 10 mg of IV Cardizem slowly.  ----------------------------------------- 10:09 PM on 04/16/2015 -----------------------------------------  Patient is slightly more responsive at this time. Vitals stable  ____________________________________________   FINAL CLINICAL IMPRESSION(S) / ED DIAGNOSES  Final diagnoses:  Altered mental status, unspecified altered mental status type          Jene Every, MD 04/16/15 2304

## 2015-04-16 NOTE — ED Notes (Signed)
MD at bedside. 

## 2015-04-17 ENCOUNTER — Inpatient Hospital Stay (HOSPITAL_COMMUNITY)
Admit: 2015-04-17 | Discharge: 2015-04-17 | Disposition: A | Discharge status: Home / Self Care | Payer: Medicare Other | Attending: Cardiovascular Disease | Admitting: Cardiovascular Disease

## 2015-04-17 ENCOUNTER — Encounter: Payer: Self-pay | Admitting: Internal Medicine

## 2015-04-17 DIAGNOSIS — E785 Hyperlipidemia, unspecified: Secondary | ICD-10-CM | POA: Diagnosis present

## 2015-04-17 DIAGNOSIS — Z833 Family history of diabetes mellitus: Secondary | ICD-10-CM | POA: Diagnosis not present

## 2015-04-17 DIAGNOSIS — Z7901 Long term (current) use of anticoagulants: Secondary | ICD-10-CM | POA: Diagnosis not present

## 2015-04-17 DIAGNOSIS — I480 Paroxysmal atrial fibrillation: Secondary | ICD-10-CM | POA: Diagnosis not present

## 2015-04-17 DIAGNOSIS — Z8673 Personal history of transient ischemic attack (TIA), and cerebral infarction without residual deficits: Secondary | ICD-10-CM | POA: Diagnosis not present

## 2015-04-17 DIAGNOSIS — I4891 Unspecified atrial fibrillation: Secondary | ICD-10-CM

## 2015-04-17 DIAGNOSIS — R4182 Altered mental status, unspecified: Secondary | ICD-10-CM | POA: Diagnosis not present

## 2015-04-17 DIAGNOSIS — Z87440 Personal history of urinary (tract) infections: Secondary | ICD-10-CM | POA: Diagnosis not present

## 2015-04-17 DIAGNOSIS — E039 Hypothyroidism, unspecified: Secondary | ICD-10-CM | POA: Diagnosis present

## 2015-04-17 DIAGNOSIS — I951 Orthostatic hypotension: Secondary | ICD-10-CM | POA: Diagnosis not present

## 2015-04-17 DIAGNOSIS — E860 Dehydration: Secondary | ICD-10-CM | POA: Diagnosis present

## 2015-04-17 DIAGNOSIS — Z9889 Other specified postprocedural states: Secondary | ICD-10-CM | POA: Diagnosis not present

## 2015-04-17 DIAGNOSIS — Z86011 Personal history of benign neoplasm of the brain: Secondary | ICD-10-CM | POA: Diagnosis not present

## 2015-04-17 DIAGNOSIS — F0390 Unspecified dementia without behavioral disturbance: Secondary | ICD-10-CM | POA: Diagnosis present

## 2015-04-17 DIAGNOSIS — E230 Hypopituitarism: Secondary | ICD-10-CM | POA: Diagnosis present

## 2015-04-17 DIAGNOSIS — I248 Other forms of acute ischemic heart disease: Secondary | ICD-10-CM | POA: Diagnosis present

## 2015-04-17 DIAGNOSIS — G9340 Encephalopathy, unspecified: Secondary | ICD-10-CM | POA: Diagnosis present

## 2015-04-17 DIAGNOSIS — M1990 Unspecified osteoarthritis, unspecified site: Secondary | ICD-10-CM | POA: Diagnosis present

## 2015-04-17 DIAGNOSIS — Z66 Do not resuscitate: Secondary | ICD-10-CM | POA: Diagnosis present

## 2015-04-17 DIAGNOSIS — R7989 Other specified abnormal findings of blood chemistry: Secondary | ICD-10-CM | POA: Diagnosis not present

## 2015-04-17 DIAGNOSIS — Z79899 Other long term (current) drug therapy: Secondary | ICD-10-CM | POA: Diagnosis not present

## 2015-04-17 DIAGNOSIS — I482 Chronic atrial fibrillation: Secondary | ICD-10-CM | POA: Diagnosis present

## 2015-04-17 DIAGNOSIS — Z981 Arthrodesis status: Secondary | ICD-10-CM | POA: Diagnosis not present

## 2015-04-17 LAB — BASIC METABOLIC PANEL
ANION GAP: 7 (ref 5–15)
BUN: 8 mg/dL (ref 6–20)
CHLORIDE: 108 mmol/L (ref 101–111)
CO2: 24 mmol/L (ref 22–32)
Calcium: 8.2 mg/dL — ABNORMAL LOW (ref 8.9–10.3)
Creatinine, Ser: 0.91 mg/dL (ref 0.44–1.00)
GFR calc non Af Amer: 57 mL/min — ABNORMAL LOW (ref >60)
GLUCOSE: 83 mg/dL (ref 65–99)
POTASSIUM: 4 mmol/L (ref 3.5–5.1)
Sodium: 139 mmol/L (ref 135–145)

## 2015-04-17 LAB — PROTIME-INR
INR: 1.21
Prothrombin Time: 15.5 seconds — ABNORMAL HIGH (ref 11.4–15.0)

## 2015-04-17 LAB — CBC
HEMATOCRIT: 38.9 % (ref 35.0–47.0)
HEMOGLOBIN: 12.5 g/dL (ref 12.0–16.0)
MCH: 28.9 pg (ref 26.0–34.0)
MCHC: 32.2 g/dL (ref 32.0–36.0)
MCV: 90 fL (ref 80.0–100.0)
Platelets: 247 10*3/uL (ref 150–440)
RBC: 4.32 MIL/uL (ref 3.80–5.20)
RDW: 16.5 % — ABNORMAL HIGH (ref 11.5–14.5)
WBC: 11.4 10*3/uL — ABNORMAL HIGH (ref 3.6–11.0)

## 2015-04-17 LAB — TROPONIN I
TROPONIN I: 0.4 ng/mL — AB (ref <0.031)
Troponin I: 0.42 ng/mL — ABNORMAL HIGH (ref <0.031)
Troponin I: 0.24 ng/mL — ABNORMAL HIGH (ref <0.031)

## 2015-04-17 LAB — ECHOCARDIOGRAM COMPLETE: WEIGHTICAEL: 2684.8 [oz_av]

## 2015-04-17 LAB — APTT: aPTT: 35 seconds (ref 24–36)

## 2015-04-17 MED ORDER — HEPARIN BOLUS VIA INFUSION
4000.0000 [IU] | Freq: Once | INTRAVENOUS | Status: AC
  Administered 2015-04-17: 4000 [IU] via INTRAVENOUS
  Filled 2015-04-17: qty 4000

## 2015-04-17 MED ORDER — VITAMIN C 500 MG PO TABS
1000.0000 mg | ORAL_TABLET | Freq: Every day | ORAL | Status: DC
  Administered 2015-04-18 – 2015-04-20 (×3): 1000 mg via ORAL
  Filled 2015-04-17 (×3): qty 2

## 2015-04-17 MED ORDER — APIXABAN 5 MG PO TABS: 5.0000 mg | ORAL_TABLET | Freq: Two times a day (BID) | ORAL | Status: DC

## 2015-04-17 MED ORDER — CALCIUM-MAGNESIUM-ZINC 333-133-5 MG PO TABS: 1.0000 | ORAL_TABLET | Freq: Every day | ORAL | Status: DC

## 2015-04-17 MED ORDER — ACETAMINOPHEN 325 MG PO TABS
650.0000 mg | ORAL_TABLET | Freq: Four times a day (QID) | ORAL | Status: DC | PRN
  Administered 2015-04-18: 650 mg via ORAL
  Filled 2015-04-17: qty 2

## 2015-04-17 MED ORDER — ONDANSETRON HCL 4 MG PO TABS: 4.0000 mg | ORAL_TABLET | Freq: Four times a day (QID) | ORAL | Status: DC | PRN

## 2015-04-17 MED ORDER — DILTIAZEM HCL ER COATED BEADS 120 MG PO CP24
120.0000 mg | ORAL_CAPSULE | Freq: Every day | ORAL | Status: DC
  Administered 2015-04-17 – 2015-04-19 (×3): 120 mg via ORAL
  Filled 2015-04-17 (×3): qty 1

## 2015-04-17 MED ORDER — ACETAMINOPHEN 325 MG PO TABS: 650.0000 mg | ORAL_TABLET | Freq: Four times a day (QID) | ORAL | Status: DC | PRN

## 2015-04-17 MED ORDER — ADULT MULTIVITAMIN W/MINERALS CH
1.0000 | ORAL_TABLET | Freq: Every day | ORAL | Status: DC
  Administered 2015-04-18 – 2015-04-20 (×3): 1 via ORAL
  Filled 2015-04-17 (×3): qty 1

## 2015-04-17 MED ORDER — SODIUM CHLORIDE 0.9 % IV SOLN
INTRAVENOUS | Status: DC
  Administered 2015-04-17 – 2015-04-18 (×3): via INTRAVENOUS

## 2015-04-17 MED ORDER — ONDANSETRON HCL 4 MG/2ML IJ SOLN: 4.0000 mg | Freq: Four times a day (QID) | INTRAMUSCULAR | Status: DC | PRN

## 2015-04-17 MED ORDER — ATORVASTATIN CALCIUM 10 MG PO TABS
10.0000 mg | ORAL_TABLET | Freq: Every day | ORAL | Status: DC
  Administered 2015-04-17 – 2015-04-19 (×3): 10 mg via ORAL
  Filled 2015-04-17 (×3): qty 1

## 2015-04-17 MED ORDER — GLUCOSAMINE-CHONDROITIN 500-400 MG PO TABS
1.0000 | ORAL_TABLET | Freq: Two times a day (BID) | ORAL | Status: DC
Start: 2015-04-17 — End: 2015-04-17

## 2015-04-17 MED ORDER — LEVOTHYROXINE SODIUM 88 MCG PO TABS
88.0000 ug | ORAL_TABLET | Freq: Every day | ORAL | Status: DC
  Administered 2015-04-17 – 2015-04-20 (×4): 88 ug via ORAL
  Filled 2015-04-17 (×4): qty 1

## 2015-04-17 MED ORDER — ASPIRIN 81 MG PO CHEW
81.0000 mg | CHEWABLE_TABLET | Freq: Every day | ORAL | Status: DC
  Administered 2015-04-17 – 2015-04-20 (×4): 81 mg via ORAL
  Filled 2015-04-17 (×4): qty 1

## 2015-04-17 MED ORDER — APIXABAN 5 MG PO TABS
5.0000 mg | ORAL_TABLET | Freq: Two times a day (BID) | ORAL | Status: DC
  Administered 2015-04-17 – 2015-04-18 (×4): 5 mg via ORAL
  Filled 2015-04-17 (×5): qty 1

## 2015-04-17 MED ORDER — HEPARIN (PORCINE) IN NACL 100-0.45 UNIT/ML-% IJ SOLN
850.0000 [IU]/h | INTRAMUSCULAR | Status: DC
  Administered 2015-04-17: 850 [IU]/h via INTRAVENOUS
  Filled 2015-04-17: qty 250

## 2015-04-17 MED ORDER — SODIUM CHLORIDE 0.9% FLUSH
3.0000 mL | Freq: Two times a day (BID) | INTRAVENOUS | Status: DC
  Administered 2015-04-17 – 2015-04-20 (×4): 3 mL via INTRAVENOUS

## 2015-04-17 MED ORDER — MODAFINIL 100 MG PO TABS
100.0000 mg | ORAL_TABLET | Freq: Every day | ORAL | Status: DC
  Administered 2015-04-17 – 2015-04-20 (×4): 100 mg via ORAL
  Filled 2015-04-17 (×4): qty 1

## 2015-04-17 MED ORDER — VITAMIN D 1000 UNITS PO TABS
2000.0000 [IU] | ORAL_TABLET | Freq: Every day | ORAL | Status: DC
  Administered 2015-04-18 – 2015-04-20 (×3): 2000 [IU] via ORAL
  Filled 2015-04-17 (×3): qty 2

## 2015-04-17 MED ORDER — HYDROCORTISONE 10 MG PO TABS
20.0000 mg | ORAL_TABLET | Freq: Three times a day (TID) | ORAL | Status: DC
  Administered 2015-04-17 – 2015-04-20 (×10): 20 mg via ORAL
  Filled 2015-04-17 (×2): qty 2
  Filled 2015-04-17: qty 1
  Filled 2015-04-17: qty 2
  Filled 2015-04-17 (×4): qty 1
  Filled 2015-04-17: qty 2
  Filled 2015-04-17: qty 1
  Filled 2015-04-17: qty 2
  Filled 2015-04-17: qty 1
  Filled 2015-04-17: qty 2
  Filled 2015-04-17 (×3): qty 1

## 2015-04-17 MED ORDER — ACETAMINOPHEN 650 MG RE SUPP: 650.0000 mg | Freq: Four times a day (QID) | RECTAL | Status: DC | PRN

## 2015-04-17 MED ORDER — VITAMIN E 180 MG (400 UNIT) PO CAPS
400.0000 [IU] | ORAL_CAPSULE | Freq: Every day | ORAL | Status: DC
  Administered 2015-04-18 – 2015-04-20 (×3): 400 [IU] via ORAL
  Filled 2015-04-17 (×4): qty 1

## 2015-04-17 MED ORDER — DESMOPRESSIN ACETATE 0.1 MG PO TABS
0.1000 mg | ORAL_TABLET | Freq: Two times a day (BID) | ORAL | Status: DC
  Administered 2015-04-17 – 2015-04-20 (×7): 0.1 mg via ORAL
  Filled 2015-04-17 (×9): qty 1

## 2015-04-17 MED ORDER — ALPRAZOLAM 0.25 MG PO TABS: 0.2500 mg | ORAL_TABLET | Freq: Two times a day (BID) | ORAL | Status: DC | PRN

## 2015-04-17 NOTE — Care Management Important Message (Signed)
Important Message  Patient Details  Name: Natalie Rollins MRN: [id number] Date of Birth: [birth year]   Medicare Important Message Given:  Yes    Marily MemosLisa M Ashutosh Dieguez, RN 04/17/2015, 12:22 PM

## 2015-04-17 NOTE — Care Management Note (Signed)
Case Management Note  Patient Details  Name: Natalie Rollins MRN: [id number] Date of Birth: [birth year]  Subjective/Objective:  CM consult for discharge planning. Admitted with altered mental status and afib. Now in NSR. Attempted to meet with patient and family at bedside but  no family present CM visit. Patient's husband is also in the hospital. Patient is a readmission. At last visit, patient was referred to [facility] for PT, MSW and HHA. Would benefit from SN as well. Patient was open only to PT before readmission. Patient is open to [facility] for a HHA three days per week for 4 hours per day. TC to room again and spoke with son Natalie Rollins. He states [facility] and wants to speak with CSW. He doesn't feel patient is capable of going home at this point.  Referral placed for PT eval.  Will update CSW.                   Action/Plan: Need HH order for SN, PT, SW and HHA if DC home.   Expected Discharge Date:                  Expected Discharge Plan:     In-House Referral:     Discharge planning Services     Post Acute Care Choice:    Choice offered to:     DME Arranged:    DME Agency:     HH Arranged:    HH Agency:     Status of Service:     Medicare Important Message Given:  Yes Date Medicare IM Given:    Medicare IM give by:    Date Additional Medicare IM Given:    Additional Medicare Important Message give by:     If discussed at Long Length of Stay Meetings, dates discussed:    Additional Comments:  Marily MemosLisa M Chancy Smigiel, RN 04/17/2015, 1:53 PM

## 2015-04-17 NOTE — Plan of Care (Signed)
Problem: SLP Dysphagia Goals Goal: Misc Dysphagia Goal Pt will safely tolerate po diet of least restrictive consistency w/ no overt s/s of aspiration noted by Staff/pt/family x3 sessions.    

## 2015-04-17 NOTE — Progress Notes (Signed)
Per Dr. Patel hold PAllena Katz medications until ST completes evaluation, consult ordered. Trudee KusterBrandi R Mansfield

## 2015-04-17 NOTE — Evaluation (Signed)
Clinical/Bedside Swallow Evaluation Patient Details  Name: Fonnie Muancy Stevens Haik MRN: [id number] Date of Birth: 1929-03-06  Today's Date: 04/17/2015 Time: SLP Start Time (ACUTE ONLY): 1000 SLP Stop Time (ACUTE ONLY): 1100 SLP Time Calculation (min) (ACUTE ONLY): 60 min  Past Medical History:  Past Medical History  Diagnosis Date  . PAF (paroxysmal atrial fibrillation) (HCC)     a. unknown if PAF vs persistent at this time given her travel history; b. on ; c. history of post op a-fib 07/2010  . Pituitary mass (HCC)     a. 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 History of stroke x 2    a. left internal capsule CVA 07/2010; b. right stroke in right basal ganglia 2013  . HLD (hyperlipidemia)   . Arthritis     a. s/p lumbar fusion c/b broken hardware w/ removal of the fusion & subsequent revision most recently 07/2010  . Recurrent UTI   . Hydrocephalus     has a shunt placed   . Stroke (HCC)     x 2  . Dementia   . Allergy    Past Surgical History:  Past Surgical History  Procedure Laterality Date  . Spine surgery      metal rods places   . Patella fracture surgery      left   . Tibia fracture surgery      right  . Humerus surgery      right   HPI:      Assessment / Plan / Recommendation Clinical Impression  Pt presented w/ adequate toleration of thin liquids and purees/soft solids w/ no overt s/s of aspiration noted; no significant, overt oral phase deficits noted. Of note, pt required verbal encouragement and cues to participate w/ the eating/drinking tasks sec. to declined Cognitive status but w/ support, pt helped to feed self and managed po boluses appropriately; stated she "did not like applesauce". Pt tolerated swallowing pills whole w/ puree - rec. for increased safety and ease of swallowing. Rec. a Dys. 3 diet w/ thin liquids; aspiration precautions. Assistance at meals for support w/ feeding d/t overall weakness and  declined Cognitive status at this time.     Aspiration Risk  Mild aspiration risk (but reduced following precautions)    Diet Recommendation  Dys. 3 w/ thin liquids; moistened foods/meats; general aspiration precautions; assistance during meals; reduce distractions.  Medication Administration: Whole meds with puree    Other  Recommendations Recommended Consults:  (Dietician) Oral Care Recommendations: Oral care BID;Staff/trained caregiver to provide oral care   Follow up Recommendations  None (TBD)    Frequency and Duration min 2x/week  1 week       Prognosis Prognosis for Safe Diet Advancement: Good Barriers to Reach Goals: Cognitive deficits      Swallow Study   General Date of Onset: 04/16/15 Type of Study: Bedside Swallow Evaluation Previous Swallow Assessment: none indicated Diet Prior to this Study: Regular;Thin liquids (per report) Temperature Spikes Noted: No (wbc elevated) Respiratory Status: Room air History of Recent Intubation: No Behavior/Cognition: Alert;Pleasant mood;Confused;Distractible;Requires cueing;Doesn't follow directions Oral Cavity Assessment: Dry;Dried secretions (limited sec. to Cognitive status) Oral Care Completed by SLP: Yes (attempted; limited) Oral Cavity - Dentition: Adequate natural dentition Vision:  (n/a) Self-Feeding Abilities: Total assist Patient Positioning: Upright in bed Baseline Vocal Quality: Low vocal intensity (min. verbal overall) Volitional Cough: Cognitively unable to elicit Volitional Swallow: Unable to elicit    Oral/Motor/Sensory Function Overall Oral Motor/Sensory  Function: Within functional limits (w/ bolus management)   Ice Chips Ice chips: Within functional limits Presentation: Spoon (fed; 3 trials)   Thin Liquid Thin Liquid: Within functional limits Presentation: Cup;Self Fed;Straw (assisted; ~4 ozs total)    Nectar Thick Nectar Thick Liquid: Not tested   Honey Thick Honey Thick Liquid: Not tested   Puree  Puree: Within functional limits Presentation: Spoon (fed; ~4 ozs )   Solid   GO   Solid: Within functional limits (mech soft; moistened well) Presentation: Spoon (fed; 5 trials)        Jerilynn Som, MS, CCC-SLP  Seleta Hovland 04/17/2015,6:05 PM

## 2015-04-17 NOTE — H&P (Addendum)
[facility] [place] at [facility]   PATIENT NAME: Cordella Nyquist    MR#:  [id number]  DATE OF BIRTH:  1929/07/01  DATE OF ADMISSION:  04/16/2015  PRIMARY CARE PHYSICIAN: Vonita Moss, MD   REQUESTING/REFERRING PHYSICIAN:   CHIEF COMPLAINT:   Chief Complaint  Patient presents with  . Altered Mental Status  . Atrial Fibrillation    HISTORY OF PRESENT ILLNESS: Guy Toney  is a 82 y.o. female with a known history of proximal atrial fibrillation, meningioma with panhypopituitarism, CVA, hyperlipidemia, arthritis, dementia presented to the emergency room with confusion and change in mental status. Patient is arousable to only painful stimuli, not oriented to time place and person. She was found to be in atrial fibrillation in the emergency room and she takes oral Eliquis for anticoagulations outpatient. Much history could be obtained from the patient. No family was available at bedside when history and physical were done.  PAST MEDICAL HISTORY:   Past Medical History  Diagnosis Date  . PAF (paroxysmal atrial fibrillation) (HCC)     a. unknown if PAF vs persistent at this time given her travel history; b. on ; c. history of post op a-fib 07/2010  . Pituitary mass (HCC)     a. 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 History of stroke x 2    a. left internal capsule CVA 07/2010; b. right stroke in right basal ganglia 2013  . HLD (hyperlipidemia)   . Arthritis     a. s/p lumbar fusion c/b broken hardware w/ removal of the fusion & subsequent revision most recently 07/2010  . Recurrent UTI   . Hydrocephalus     has a shunt placed   . Stroke (HCC)     x 2  . Dementia   . Allergy     PAST SURGICAL HISTORY: Past Surgical History  Procedure Laterality Date  . Spine surgery      metal rods places   . Patella fracture surgery      left   . Tibia fracture surgery      right  . Humerus surgery      right     SOCIAL HISTORY:  Social History  Substance Use Topics  . Smoking status: Never Smoker   . Smokeless tobacco: Never Used  . Alcohol Use: No     Comment: a glass of wine on occasion    FAMILY HISTORY:  Family History  Problem Relation Age of Onset  . Diabetes Son   . Kidney Stones Son     DRUG ALLERGIES: No Known Allergies  REVIEW OF SYSTEMS:  Could not be obtained secondary to altered mental status.  MEDICATIONS AT HOME:  Prior to Admission medications   Medication Sig Start Date End Date Taking? Authorizing Provider  acetaminophen (TYLENOL) 325 MG tablet Take 2 tablets (650 mg total) by mouth every 6 (six) hours as needed for mild pain (or Fever >/= 101). 04/13/15  Yes Ramonita Lab, MD  ALPRAZolam (XANAX) 0.25 MG tablet Take 1 tablet (0.25 mg total) by mouth 2 (two) times daily as needed for anxiety. 04/13/15  Yes Ramonita Lab, MD  atorvastatin (LIPITOR) 10 MG tablet Take 10 mg by mouth at bedtime.   Yes Historical Provider, MD  Calcium-Magnesium-Zinc [phone] MG TABS Take 1 tablet by mouth daily.    Yes Historical Provider, MD  cholecalciferol (VITAMIN D) 1000 units tablet Take 2,000 Units by mouth daily.   Yes  Historical Provider, MD  desmopressin (DDAVP) 0.1 MG tablet Take 0.1 mg by mouth 2 (two) times daily.   Yes Historical Provider, MD  diltiazem (CARDIZEM CD) 120 MG 24 hr capsule Take 120 mg by mouth daily.   Yes Historical Provider, MD  glucosamine-chondroitin 500-400 MG tablet Take 1 tablet by mouth 2 (two) times daily.   Yes Historical Provider, MD  hydrocortisone (CORTEF) 10 MG tablet Take 20 mg by mouth 3 (three) times daily.    Yes Historical Provider, MD  levothyroxine (SYNTHROID, LEVOTHROID) 88 MCG tablet Take 88 mcg by mouth daily before breakfast.   Yes Historical Provider, MD  Multiple Vitamin (MULTIVITAMIN WITH MINERALS) TABS tablet Take 1 tablet by mouth daily.   Yes Historical Provider, MD  vitamin C (ASCORBIC ACID) 500 MG tablet Take 1,000 mg by mouth  daily.   Yes Historical Provider, MD  vitamin E 400 UNIT capsule Take 400 Units by mouth daily.    Yes Historical Provider, MD  apixaban (ELIQUIS) 5 MG TABS tablet Take 5 mg by mouth 2 (two) times daily. Reported on 04/16/2015    Historical Provider, MD      PHYSICAL EXAMINATION:   VITAL SIGNS: Blood pressure 123/76, pulse 75, temperature 97.4 F (36.3 C), temperature source Axillary, resp. rate 16, weight 79.606 kg (175 lb 8 oz), SpO2 100 %.  GENERAL:  82 y.o.-year-old patient lying in the bed confused  EYES: Pupils equal, round, reactive to light and accommodation. No scleral icterus. Extraocular muscles intact.  HEENT: Head atraumatic, normocephalic. Oropharynx and nasopharynx clear.  NECK:  Supple, no jugular venous distention. No thyroid enlargement, no tenderness.  LUNGS: Normal breath sounds bilaterally, no wheezing, rales,rhonchi or crepitation. No use of accessory muscles of respiration.  CARDIOVASCULAR: S1, S2 normal. No murmurs, rubs, or gallops.  ABDOMEN: Soft, nontender, nondistended. Bowel sounds present. No organomegaly or mass.  EXTREMITIES: No pedal edema, cyanosis, or clubbing.  NEUROLOGIC: Not oriented to time,place and person. PSYCHIATRIC: could not be assessed SKIN: No obvious rash, lesion, or ulcer.   LABORATORY PANEL:   CBC  Recent Labs Lab 04/10/15 1910 04/11/15 0542 04/12/15 0448 04/16/15 1906  WBC 10.7 7.8 8.0 12.8*  HGB 12.6 11.7* 10.7* 12.8  HCT 37.7 35.2 32.3* 39.3  PLT 226 193 195 303  MCV 88.6 88.0 88.6 90.0  MCH 29.7 29.2 29.3 29.4  MCHC 33.5 33.3 33.1 32.6  RDW 16.1* 16.2* 16.1* 16.4*  LYMPHSABS 3.0  --   --  1.9  MONOABS 0.9  --   --  1.2*  EOSABS 0.3  --   --  0.1  BASOSABS 0.1  --   --  0.1   ------------------------------------------------------------------------------------------------------------------  Chemistries   Recent Labs Lab 04/10/15 1910 04/11/15 0542 04/12/15 0448 04/16/15 1906  NA 138 139 136 136  K 2.7* 3.6 4.3  3.7  CL 106 111 113* 103  CO2 22 23 20* 18*  GLUCOSE 112* 81 93 185*  BUN CREATININE 0.78 0.63 0.52 1.33*  CALCIUM 8.2* 7.8* 7.8* 8.6*  MG 1.7  --   --   --   AST 23  --  12* 32  ALT 17  --  13* 18  ALKPHOS 116  --  87 116  BILITOT 2.3*  --  1.1 1.3*   ------------------------------------------------------------------------------------------------------------------ estimated creatinine clearance is 32.7 mL/min (by C-G formula based on Cr of 1.33). ------------------------------------------------------------------------------------------------------------------ No results for input(s): TSH, T4TOTAL, T3FREE, THYROIDAB in the last 72 hours.  Invalid input(s): FREET3  Coagulation profile  Recent Labs Lab 04/12/15 0448  INR 1.45   ------------------------------------------------------------------------------------------------------------------- No results for input(s): DDIMER in the last 72 hours. -------------------------------------------------------------------------------------------------------------------  Cardiac Enzymes  Recent Labs Lab 04/12/15 1752 04/12/15 2126 04/16/15 1906  TROPONINI <0.03 <0.03 0.06*   ------------------------------------------------------------------------------------------------------------------ Invalid input(s): POCBNP  ---------------------------------------------------------------------------------------------------------------  Urinalysis    Component Value Date/Time   COLORURINE YELLOW* 04/16/2015 2206   COLORURINE Yellow 05/06/2014 0853   APPEARANCEUR HAZY* 04/16/2015 2206   APPEARANCEUR Clear 05/06/2014 0853   LABSPEC 1.013 04/16/2015 2206   LABSPEC 1.015 05/06/2014 0853   PHURINE 6.0 04/16/2015 2206   PHURINE 6.0 05/06/2014 0853   GLUCOSEU NEGATIVE 04/16/2015 2206   GLUCOSEU Negative 05/06/2014 0853   HGBUR NEGATIVE 04/16/2015 2206   HGBUR Negative 05/06/2014 0853   BILIRUBINUR NEGATIVE 04/16/2015 2206    BILIRUBINUR Negative 05/06/2014 0853   KETONESUR 1+* 04/16/2015 2206   KETONESUR 1+ 05/06/2014 0853   PROTEINUR 30* 04/16/2015 2206   PROTEINUR 30 mg/dL 16/10/9602 5409   NITRITE NEGATIVE 04/16/2015 2206   NITRITE Negative 05/06/2014 0853   LEUKOCYTESUR NEGATIVE 04/16/2015 2206   LEUKOCYTESUR Negative 05/06/2014 0853     RADIOLOGY: Ct Head Wo Contrast  04/16/2015  CLINICAL DATA:  Decreased level of consciousness in the colon altered mental status today. Ventriculostomy shunt catheter in place. Initial encounter. EXAM: CT HEAD WITHOUT CONTRAST TECHNIQUE: Contiguous axial images were obtained from the base of the skull through the vertex without intravenous contrast. COMPARISON:  Head CT scan 04/10/2015 and 03/14/2015. FINDINGS: Right parietal approach ventriculostomy shunt catheter is unchanged. There is no hydrocephalus. Densely calcified sellar meningioma or macroadenoma is unchanged. There is atrophy and chronic microvascular ischemic disease. No evidence of acute intracranial abnormality including hemorrhage, infarct, mass lesion, mass effect, midline shift or abnormal extra-axial fluid collection is identified. No pneumocephalus. Bilateral mastoid effusions are unchanged. No fracture. IMPRESSION: No acute abnormality. Negative for hydrocephalus with a ventriculostomy shunt catheter in place. Atrophy and chronic microvascular ischemic change. Bilateral mastoid effusions, unchanged. Densely calcified suprasellar meningioma, unchanged. Electronically Signed   By: Drusilla Kanner M.D.   On: 04/16/2015 19:32   Dg Chest Port 1 View  04/16/2015  CLINICAL DATA:  Altered mental status. Decreased level consciousness today. Initial encounter. EXAM: PORTABLE CHEST 1 VIEW COMPARISON:  Single view of the chest 04/10/2015 and 03/14/2015. FINDINGS: There is cardiomegaly without edema. Mild atelectasis is present in the right lung base. No pneumothorax or pleural effusion. Remote right rib fractures and  surgical neck fracture right humerus are noted. Ventriculostomy shunt catheter is in place. IMPRESSION: Cardiomegaly without acute disease. Electronically Signed   By: Drusilla Kanner M.D.   On: 04/16/2015 19:29    EKG: Orders placed or performed during the hospital encounter of 04/16/15  . ED EKG  . ED EKG  . EKG 12-Lead  . EKG 12-Lead    IMPRESSION AND PLAN: 82 year old female patient with history of meningioma, panhypopituitarism, proximal atrial fibrillation, CVA, hyperlipidemia was brought to the emergency room with confusion. Admitting diagnosis 1. Altered mental status 2. Chronic atrial fibrillation 3. History of meningioma 4. Hyperlipidemia 5. History of CVA Treatment plan: Admit patient to telemetry Resume Eliquis for anti-coagulation IV fluid hydration 781 mg daily Cycle troponin to rule out ischemia Keep patient nothing by mouth Follow-up cultures.   All the records are reviewed and case discussed with ED provider. Management plans discussed with the patient, family and they are in agreement.  CODE STATUS:DNR    Code Status Orders  Start     Ordered   04/17/15 0023  Do not attempt resuscitation (DNR)   Continuous    Question Answer Comment  In the event of cardiac or respiratory ARREST Do not call a "code blue"   In the event of cardiac or respiratory ARREST Do not perform Intubation, CPR, defibrillation or ACLS   In the event of cardiac or respiratory ARREST Use medication by any route, position, wound care, and other measures to relive pain and suffering. May use oxygen, suction and manual treatment of airway obstruction as needed for comfort.      04/17/15 0022    Code Status History    Date Active Date Inactive Code Status Order ID Comments User Context   04/10/2015 10:35 PM 04/13/2015  5:34 PM DNR [id number]  Shaune PollackQing Chen, MD Inpatient   09/07/2014  1:04 AM 09/07/2014  5:15 PM Full Code [id number]  Oralia Manisavid Willis, MD Inpatient    Advance Directive  Documentation        Most Recent Value   Type of Advance Directive  Healthcare Power of Attorney   Pre-existing out of facility DNR order (yellow form or pink MOST form)     "MOST" Form in Place?         TOTAL TIME TAKING CARE OF THIS PATIENT: 33 minutes.    Ihor AustinPavan Carry Ortez M.D on 04/17/2015 at 12:23 AM  Between 7am to 6pm - Pager - [phone]  After 6pm go to [email or web address] - password [email or web address] [facility]  [facility]  Office  [phone]  CC: Primary care physician; Vonita MossMark Crissman, MD    Patient troponin is elevated. Will start patient on aspirin and iv heparin drip. Cardiology consultation for abnormal troponin.

## 2015-04-17 NOTE — Progress Notes (Signed)
Notified by lab of error with lab results. Critical troponin value reported. Md notified. Acknowledged. New orders placed.

## 2015-04-17 NOTE — Consult Note (Signed)
   [facility] CM Inpatient Consult   04/17/2015  Jarae Nemmers [birth year] [id number]   Met with the patient, spouse and son regarding the benefits of [facility] Care Management services both husband and wife are inpatient and are eligible. Explained that [place] [facility] is a covered benefit of her Medicare insurance. Reviewed information for [facility] Care Management and a folder was provided with contact information.  Explained that [place] Management does not interfere with or replace any services arranged by the inpatient care management staff. Patient and spouse requested a return visit related to spouse stating "we have a lot going on right now." Made patient and spouse aware that even if discharged before [facility] can make a return visit, they may call [facility]-office or Hospital Liaison's contact information to sign up with [facility]. For questions please contact:  Raha Tennison RN, [place] [facility] [facility] Liaison  [phone]) Business Mobile [phone]) Toll free office

## 2015-04-17 NOTE — Progress Notes (Signed)
PHARMACIST - PHYSICIAN ORDER COMMUNICATION  CONCERNING: P&T Medication Policy on Herbal Medications  DESCRIPTION:  This patient's order for:  Glucosamine/chondroitin and calcium/magnesium/zinc  have been noted.  This product(s) is classified as an "herbal" or natural product. Due to a lack of definitive safety studies or FDA approval, nonstandard manufacturing practices, plus the potential risk of unknown drug-drug interactions while on inpatient medications, the Pharmacy and Therapeutics Committee does not permit the use of "herbal" or natural products of this type within [facility].   ACTION TAKEN: The pharmacy department is unable to verify this order at this time Please reevaluate patient's clinical condition at discharge and address if the herbal or natural product(s) should be resumed at that time.

## 2015-04-17 NOTE — Progress Notes (Signed)
ANTICOAGULATION CONSULT NOTE - Initial Consult  Pharmacy Consult for heparin Indication: chest pain/ACS  No Known Allergies  Patient Measurements: Weight: 167 lb 12.8 oz (76.114 kg) Heparin Dosing Weight: 70 kg  Vital Signs: Temp: 97.7 F (36.5 C) (03/31 0209) Temp Source: Oral (03/31 0209) BP: 117/70 mmHg (03/31 0209) Pulse Rate: 84 (03/31 0209)  Labs:  Recent Labs  04/16/15 1906 04/17/15 0337  HGB 12.8 12.5  HCT 39.3 38.9  PLT 303 247  CREATININE 1.33* 0.91  TROPONINI 0.06* 0.42*    Estimated Creatinine Clearance: 46.8 mL/min (by C-G formula based on Cr of 0.91).   Medical History: Past Medical History  Diagnosis Date  . PAF (paroxysmal atrial fibrillation) (HCC)     a. unknown if PAF vs persistent at this time given her travel history; b. on ; c. history of post op a-fib 07/2010  . Pituitary mass (HCC)     a. 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 History of stroke x 2    a. left internal capsule CVA 07/2010; b. right stroke in right basal ganglia 2013  . HLD (hyperlipidemia)   . Arthritis     a. s/p lumbar fusion c/b broken hardware w/ removal of the fusion & subsequent revision most recently 07/2010  . Recurrent UTI   . Hydrocephalus     has a shunt placed   . Stroke (HCC)     x 2  . Dementia   . Allergy     Medications:  Infusions:  . sodium chloride 75 mL/hr at 04/17/15 0145  . heparin      Assessment: 85 yof cc AMS/AF with positive troponin. Pharmacy consulted to dose heparin for ACS. Note, patient is prescribed Eliquis at home but has not been taking it. Unsure when last dose was, last filled in December 2016. Will wait for baseline APTT and PT/INR result before sending dose.   Goal of Therapy:  Heparin level 0.3-0.7 units/ml Monitor platelets by anticoagulation protocol: Yes   Plan:  Give 4000 units bolus x 1 Start heparin infusion at 850 units/hr Check anti-Xa level in 8 hours and daily  while on heparin Continue to monitor H&H and platelets  Carola FrostNathan A Janiel Derhammer, Pharm.D., BCPS Clinical Pharmacist 04/17/2015,5:30 AM

## 2015-04-17 NOTE — Consult Note (Signed)
Cardiology Consult    Patient ID: Natalie Rollins MRN: [id number], DOB/AGE: 80-06-1929   Admit date: 04/16/2015 Date of Consult: 04/17/2015  Primary Physician: Vonita MossMark Crissman, MD Reason for Consult: Atrial Fibrillation; Elevated Troponin Primary Cardiologist: Dr. Mariah MillingGollan Requesting Provider: Dr. Tobi BastosPyreddy   History of Present Illness    Natalie Rollins is a 82 y.o. female with past medical history of PAF (on Eliquis), HLD, CVA (right basal ganglia in 2013), Hypothyroidism, Pituitary meningioma, dementia and arthritis who [facility] for altered mental status and elevated HR.   Of note, she was recently admitted from 3/24 - 04/13/2015 for weakness and altered mental status. Her bilirubin was found to be elevated that admission and had a positive Murphy sign. Surgery was consulted but dur to her being asymptomatic and a poor surgical candidate, surgical interventions were not undertaken.  Upon arrival of EMS, her HR was elevated into the 190's but improved to the 120's following administration of IV Cardizem.   Overnight converted to normal sinus rhythm at 9 PM No family by the bedside to provide history, patient is minimally conversant at baseline, sleepy this morning, unable to open one eye. Was more verbal fo hospitalist       Past Medical History   Past Medical History  Diagnosis Date  . PAF (paroxysmal atrial fibrillation) (HCC)     a. unknown if PAF vs persistent at this time given her travel history; b. on ; c. history of post op a-fib 07/2010  . Pituitary mass (HCC)     a. 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 History of stroke x 2    a. left internal capsule CVA 07/2010; b. right stroke in right basal ganglia 2013  . HLD (hyperlipidemia)   . Arthritis     a. s/p lumbar fusion c/b broken hardware w/ removal of the fusion & subsequent revision most recently 07/2010  . Recurrent UTI   .  Hydrocephalus     has a shunt placed   . Stroke (HCC)     x 2  . Dementia   . Allergy     Past Surgical History  Procedure Laterality Date  . Spine surgery      metal rods places   . Patella fracture surgery      left   . Tibia fracture surgery      right  . Humerus surgery      right     Allergies  No Known Allergies  Inpatient Medications    . aspirin  81 mg Oral Daily  . atorvastatin  10 mg Oral QHS  . cholecalciferol  2,000 Units Oral Daily  . desmopressin  0.1 mg Oral BID  . diltiazem  120 mg Oral Daily  . hydrocortisone  20 mg Oral TID  . levothyroxine  88 mcg Oral QAC breakfast  . multivitamin with minerals  1 tablet Oral Daily  . sodium chloride flush  3 mL Intravenous Q12H  . vitamin C  1,000 mg Oral Daily  . vitamin E  400 Units Oral Daily    Family History    Family History  Problem Relation Age of Onset  . Diabetes Son   . Kidney Stones Son     Social History    Social History   Social History  . Marital Status: Married    Spouse Name: N/A  . Number of Children: N/A  . Years of Education:  N/A   Occupational History  . retired    Social History Main Topics  . Smoking status: Never Smoker   . Smokeless tobacco: Never Used  . Alcohol Use: No     Comment: a glass of wine on occasion  . Drug Use: No  . Sexual Activity: Not on file   Other Topics Concern  . Not on file   Social History Narrative     Review of Systems    General:  No chills, fever, night sweats or weight changes.  Cardiovascular:  No chest pain, dyspnea on exertion, edema, orthopnea, palpitations, paroxysmal nocturnal dyspnea. Dermatological: No rash, lesions/masses Respiratory: No cough, dyspnea Urologic: No hematuria, dysuria Abdominal:   No nausea, vomiting, diarrhea, bright red blood per rectum, melena, or hematemesis Neurologic:  No visual changes, wkns, changes in mental status. All other systems reviewed and are otherwise negative except as noted  above.  Physical Exam    Blood pressure 139/83, pulse 75, temperature 97.8 F (36.6 C), temperature source Oral, resp. rate 20, weight 167 lb 12.8 oz (76.114 kg), SpO2 97 %.  General: NAD, sleeping Psych: Unable to assess Neck: no JVP, supple HEENT: Head: Normocephalic. Nose: Nose normal.  Neuro: Minimally alert, with sternal rub will open her left eye, noncommunicative.  :ungs: Resp regular and unlabored, CTA. Heart: RRR no s3, s4, or murmurs. Pulmonary/Chest: Effort normal and breath sounds normal. No respiratory distress. She has no wheezes. She has no rales. She exhibits no tenderness.  Abdomen: Soft, non-tender, non-distended, BS + x 4.  Musculoskeletal: Normal range of motion. She exhibits no edema or tenderness.  Extremities: No clubbing, cyanosis or edema. DP/PT/Radials 2+ and equal bilaterally. SKIn:  Regions of ecchymosis on her legs  Labs    Troponin (Point of Care Test) No results for input(s): TROPIPOC in the last 72 hours.  Recent Labs  04/16/15 1906 04/17/15 0337  TROPONINI 0.06* 0.42*   Lab Results  Component Value Date   WBC 11.4* 04/17/2015   HGB 12.5 04/17/2015   HCT 38.9 04/17/2015   MCV 90.0 04/17/2015   PLT 247 04/17/2015    Recent Labs Lab 04/16/15 1906 04/17/15 0337  NA 136 139  K 3.7 4.0  CL 103 108  CO2 18* 24  BUN 7 8  CREATININE 1.33* 0.91  CALCIUM 8.6* 8.2*  PROT 6.1*  --   BILITOT 1.3*  --   ALKPHOS 116  --   ALT 18  --   AST 32  --   GLUCOSE 185* 83   Lab Results  Component Value Date   CHOL 106 05/07/2014   HDL 29* 05/07/2014   LDLCALC 55 05/07/2014   TRIG 109 05/07/2014   No results found for: [facility]   Radiology Studies    Dg Chest 1 View  04/10/2015  CLINICAL DATA:  Weakness.  Found unresponsive today. EXAM: CHEST 1 VIEW COMPARISON:  03/14/2015 FINDINGS: VP shunt tubing is again noted in the lower right neck and right chest. Cardiomediastinal silhouette is unchanged. Slight elevation of the right hemidiaphragm is  unchanged. No airspace consolidation, edema, pleural effusion, or pneumothorax is identified. Sequelae of prior thoracolumbar posterior fusion are again identified with grossly similar appearance of bilateral interconnecting rod disruption. An old right humeral neck fracture is noted. IMPRESSION: No active disease. Electronically Signed   By: Sebastian Ache M.D.   On: 04/10/2015 19:59   Ct Head Wo Contrast  04/16/2015  CLINICAL DATA:  Decreased level of consciousness in the colon altered mental  status today. Ventriculostomy shunt catheter in place. Initial encounter. EXAM: CT HEAD WITHOUT CONTRAST TECHNIQUE: Contiguous axial images were obtained from the base of the skull through the vertex without intravenous contrast. COMPARISON:  Head CT scan 04/10/2015 and 03/14/2015. FINDINGS: Right parietal approach ventriculostomy shunt catheter is unchanged. There is no hydrocephalus. Densely calcified sellar meningioma or macroadenoma is unchanged. There is atrophy and chronic microvascular ischemic disease. No evidence of acute intracranial abnormality including hemorrhage, infarct, mass lesion, mass effect, midline shift or abnormal extra-axial fluid collection is identified. No pneumocephalus. Bilateral mastoid effusions are unchanged. No fracture. IMPRESSION: No acute abnormality. Negative for hydrocephalus with a ventriculostomy shunt catheter in place. Atrophy and chronic microvascular ischemic change. Bilateral mastoid effusions, unchanged. Densely calcified suprasellar meningioma, unchanged. Electronically Signed   By: Drusilla Kanner M.D.   On: 04/16/2015 19:32   Ct Head Wo Contrast  04/10/2015  CLINICAL DATA:  Altered mental status EXAM: CT HEAD WITHOUT CONTRAST TECHNIQUE: Contiguous axial images were obtained from the base of the skull through the vertex without intravenous contrast. COMPARISON:  03/14/2015 FINDINGS: No intracranial hemorrhage, mass effect or midline shift. Chronic opacification of the left  sphenoid sinus again noted. Again noted fluid and opacification of left mastoid air cells. The maxillary sinuses are unremarkable. Again noted sellar region meningioma which is unchanged from prior exam. Stable VP shunt catheter position. There is no intraventricular hemorrhage. Ventricular size is stable from prior exam. Again noted cerebral atrophy and periventricular chronic white matter disease. Stable lacunar infarcts in basal ganglia. Stable patchy subcortical chronic white matter decreased attenuation. There is improvement in previous subdural hematomas. The right subdural hematoma has resolved. Only minimal left subdural hematoma with improvement from prior exam measures 3 mm thickness decreased from prior exam. There is no evidence of acute hemorrhage within this chronic appearing hematoma. No definite acute cortical infarction. IMPRESSION: 1. Significant improvement in previous bilateral subdural hematoma. The right subdural hematoma has resolved. Only tiny residual left subdural chronic hematoma measures 3 mm thickness. No evidence of acute hemorrhage or blood products. 2. Again noted VP shunt catheter. Ventricular size is stable from prior exam. 3. Stable atrophy and chronic white matter disease. Stable small lacunar infarcts bilateral basal ganglia. No definite acute cortical infarction 4. Stable meningioma in suprasellar region. Electronically Signed   By: Natasha Mead M.D.   On: 04/10/2015 20:03   Dg Chest Port 1 View  04/16/2015  CLINICAL DATA:  Altered mental status. Decreased level consciousness today. Initial encounter. EXAM: PORTABLE CHEST 1 VIEW COMPARISON:  Single view of the chest 04/10/2015 and 03/14/2015. FINDINGS: There is cardiomegaly without edema. Mild atelectasis is present in the right lung base. No pneumothorax or pleural effusion. Remote right rib fractures and surgical neck fracture right humerus are noted. Ventriculostomy shunt catheter is in place. IMPRESSION: Cardiomegaly  without acute disease. Electronically Signed   By: Drusilla Kanner M.D.   On: 04/16/2015 19:29   US Abdomen Limited Ruq  04/11/2015  CLINICAL DATA:  82 year old female with a history of elevated bilirubin EXAM: US ABDOMEN LIMITED - RIGHT UPPER QUADRANT COMPARISON:  CT 11/27/2012 FINDINGS: Gallbladder: Partial visualization of the gallbladder secondary to overlying bowel gas, although there is evidence of cholelithiasis. Gallbladder wall measures 2 mm. Sonographic Eulah Pont sign is reported positive. Common bile duct: Diameter: 2 mm -3 mm. Liver: Heterogeneous appearance of the liver parenchyma. IMPRESSION: Cholelithiasis, with sonographic Murphy sign reported as positive, suggesting acute cholecystitis. There is overlying bowel, evident on the current sonographic survey and prior  CT, which may be confounding the [facility] sign. If there is any question of an alternative etiology, confirmatory testing with contrast-enhanced CT or nuclear medicine study may be considered. Signed, Yvone Neu. Loreta Ave, DO [facility] Electronically Signed   By: Gilmer Mor D.O.   On: 04/11/2015 09:19    EKG & Cardiac Imaging    EKG: Atrial fibrillation with RVR  Echocardiogram: pending  Assessment & Plan    82 year old female with history of a-fib in [place] March 2016, right stroke in right basal ganglia 2013, history of chronic baseline pituitary meningioma with panhypopituitarism status post VP shunt, transient history of atrial fibrillation post surgery 08/03/2010, history of left internal capsule CVA 07/2010, hypothyroidism, HLD, arthritis (status post lumbar fusion complicated by broken hardware with removal of the fusion and subsequent revision most recently 07/2010) who presented to [facility] on 05/06/14 with syncope and a 2 day history of decreased responsiveness, Patient is relatively nonverbal at baseline --Patient presents with mental status change, confusion,  atrial fibrillation with RVR  ----Atrial fibrillation, paroxysmal Converting to normal sinus rhythm at 9 PM last night She is currently on heparin, not awake enough to take oral medication Heart rate 69 bpm -Once she is taking oral medication, would restart anticoagulation, diltiazem Husband previously stopped amiodarone prior to October 2016, reason unclear -Potentially could restart amiodarone for rhythm control , 200 mg twice a day if husband agrees .  --- Mental status change Etiology unclear, UA does not look particularly suggestive of urinary tract infection She was just discharged with similar symptoms 04/13/2015 She was treated for cystitis, negative urine culture Also had elevated troponin, positive Murphy sign on ultrasound, possible acute cholecystitis, treated medically  --hx of CVA On anticoagulation, eliquis BID   Total encounter time more than 80 minutes  Greater than 50% was spent in counseling and coordination of care with the patient   Signed, Dossie Arbour, MD, Ph.D [facility]

## 2015-04-17 NOTE — Clinical Social Work Note (Signed)
Clinical Social Work Assessment  Patient Details  Name: Natalie Rollins MRN: [id number] Date of Birth: May 09, 1929  Date of referral:  04/17/15               Reason for consult:  Discharge Planning                Permission sought to share information with:  Family Supports Permission granted to share information::  Yes, Verbal Permission Granted  Name::        Agency::     Relationship::   Ellison Carwin- Husband &  Dalia Heading- Son)  Contact Information:     Housing/Transportation Living arrangements for the past 2 months:  Single Family Home Source of Information:  Spouse, Adult Children Patient Interpreter Needed:  None Criminal Activity/Legal Involvement Pertinent to Current Situation/Hospitalization:  No - Comment as needed Significant Relationships:  Adult Children, Spouse Lives with:  Spouse Do you feel safe going back to the place where you live?  Yes Need for family participation in patient care:  Yes (Comment) Ellison Carwin- Husband &  Dalia Heading- Son)  Care giving concerns:  RN Case Manager informed CSW that patient's family have concerns about her returning home.    Social Worker assessment / plan:  CSW met with patient, her daughter, son and husband at bedside. CSW explained her role. Per patient's husband- Juanda Crumble he would like patient to go home. He stated that he wants to "keep family together". Stated putting patient in a facility would break up their home. Per patient's spouse he is willing to get 7 day care form [facility] and would like to continue receiving [place] services from [facility]. Stated he'd be interested in DME to assist with caring for patient. Reported that they are not interested in a SNF or ALF at this time. Each child agreed with patient's spouse. CSW informed RN Case Manager of above. CSW is signing off. CSW is available if a CSW need were to arise.   Employment status:  Retired Forensic scientist:  Medicare PT  Recommendations:  Not assessed at this time Information / Referral to community resources:     Patient/Family's Response to care:  Patient's family would like for patient to return home at discharge stated they'd like to increase care from [facility].   Patient/Family's Understanding of and Emotional Response to Diagnosis, Current Treatment, and Prognosis:  Reported they somewhat understand why patient was admitted but would like more information. Stated they understood CSW's role and appreciated her assistance with processing their discharge concerns.   Emotional Assessment Appearance:  Appears stated age Attitude/Demeanor/Rapport:   (None) Affect (typically observed):  Calm, Pleasant Orientation:  Oriented to Self Alcohol / Substance use:    Psych involvement (Current and /or in the community):  No (Comment)  Discharge Needs  Concerns to be addressed:  Discharge Planning Concerns Readmission within the last 30 days:  No Current discharge risk:  Chronically ill Barriers to Discharge:  Continued Medical Work up   [employer], LCSW 04/17/2015, 4:03 PM

## 2015-04-17 NOTE — Progress Notes (Signed)
[facility]  Patient Status: active  [facility] is providing the following services: PT, MSW & HHA not started yet   If patient discharges after hours, please call [phone].   Dimple CaseyJason E Hinton 04/17/2015, 9:13 AM

## 2015-04-17 NOTE — Care Management (Addendum)
Family has decided that they want to take patient home. [facility] updated. No DME needs at this time. Family wishes to get patient home and have [facility] assess for needed equipment. They plan to increase [facility] to 7 days per week.

## 2015-04-17 NOTE — Progress Notes (Signed)
*  PRELIMINARY RESULTS* Echocardiogram 2D Echocardiogram has been performed.  Natalie Rollins 04/17/2015, 6:30 PM

## 2015-04-18 DIAGNOSIS — I4891 Unspecified atrial fibrillation: Secondary | ICD-10-CM

## 2015-04-18 DIAGNOSIS — R778 Other specified abnormalities of plasma proteins: Secondary | ICD-10-CM | POA: Diagnosis present

## 2015-04-18 DIAGNOSIS — R7989 Other specified abnormal findings of blood chemistry: Secondary | ICD-10-CM | POA: Diagnosis present

## 2015-04-18 LAB — TSH: TSH: 0.122 u[IU]/mL — AB (ref 0.350–4.500)

## 2015-04-18 MED ORDER — OXYCODONE HCL 5 MG PO TABS
5.0000 mg | ORAL_TABLET | Freq: Three times a day (TID) | ORAL | Status: DC | PRN
  Administered 2015-04-18: 5 mg via ORAL
  Filled 2015-04-18: qty 1

## 2015-04-18 NOTE — Progress Notes (Signed)
Patient ID: Natalie Rollins, female   DOB: 1929-05-13, 82 y.o.   MRN: [id number] [facility] PROGRESS NOTE  Natalie Rollins WUJ:[id number] DOB: November 27, 1929 DOA: 04/16/2015 PCP: Natalie Moss, MD  HPI/Subjective: Patient answers yes or no questions appropriately. As per family, she's been more alert today than she has been in a while. Family also concerned about her husband that is not doing well.  Objective: Filed Vitals:   04/18/15 0830 04/18/15 1118  BP: 158/68 154/79  Pulse: 68 76  Temp: 98.2 F (36.8 C) 98 F (36.7 C)  Resp: 20 20    Filed Weights   04/16/15 1908 04/17/15 0209  Weight: 79.606 kg (175 lb 8 oz) 76.114 kg (167 lb 12.8 oz)    ROS: Review of Systems  Constitutional: Negative for fever and chills.  Eyes: Negative for blurred vision.  Respiratory: Negative for cough and shortness of breath.   Cardiovascular: Negative for chest pain.  Gastrointestinal: Negative for nausea, vomiting, abdominal pain, diarrhea and constipation.  Genitourinary: Negative for dysuria.  Musculoskeletal: Negative for joint pain.  Neurological: Negative for dizziness and headaches.   Exam: Physical Exam  HENT:  Nose: No mucosal edema.  Mouth/Throat: No oropharyngeal exudate or posterior oropharyngeal edema.  Eyes: Conjunctivae, EOM and lids are normal. Pupils are equal, round, and reactive to light.  Neck: No JVD present. Carotid bruit is not present. No edema present. No thyroid mass and no thyromegaly present.  Cardiovascular: S1 normal and S2 normal.  Exam reveals no gallop.   No murmur heard. Pulses:      Dorsalis pedis pulses are 2+ on the right side, and 2+ on the left side.  Respiratory: No respiratory distress. She has no wheezes. She has no rhonchi. She has no rales.  GI: Soft. Bowel sounds are normal. There is no tenderness.  Musculoskeletal:       Right ankle: She exhibits swelling.       Left ankle: She exhibits swelling.  Lymphadenopathy:     She has no cervical adenopathy.  Neurological: She is alert. No cranial nerve deficit.  Skin: Skin is warm. No rash noted. Nails show no clubbing.  Psychiatric: She has a normal mood and affect.      Data Reviewed: Basic Metabolic Panel:  Recent Labs Lab 04/12/15 0448 04/16/15 1906 04/17/15 0337  NA 136 136 139  K 4.3 3.7 4.0  CL 113* 103 108  CO2 20* 18* 24  GLUCOSE 93 185* 83  BUN CREATININE 0.52 1.33* 0.91  CALCIUM 7.8* 8.6* 8.2*   Liver Function Tests:  Recent Labs Lab 04/12/15 0448 04/16/15 1906  AST 12* 32  ALT 13* 18  ALKPHOS 87 116  BILITOT 1.1 1.3*  PROT 5.3* 6.1*  ALBUMIN 2.9* 3.4*   CBC:  Recent Labs Lab 04/12/15 0448 04/16/15 1906 04/17/15 0337  WBC 8.0 12.8* 11.4*  NEUTROABS  --  9.6*  --   HGB 10.7* 12.8 12.5  HCT 32.3* 39.3 38.9  MCV 88.6 90.0 90.0  PLT 195 303 247   Cardiac Enzymes:  Recent Labs Lab 04/12/15 2126 04/16/15 1906 04/17/15 0337 04/17/15 0731 04/17/15 1352  TROPONINI <0.03 0.06* 0.42* 0.40* 0.24*     Recent Results (from the past 240 hour(s))  Urine culture     Status: None   Collection Time: 04/11/15  8:22 AM  Result Value Ref Range Status   Specimen Description URINE, CLEAN CATCH  Final   Special Requests NONE  Final  Culture NO GROWTH 2 DAYS  Final   Report Status 04/13/2015 FINAL  Final     Studies: Ct Head Wo Contrast  04/16/2015  CLINICAL DATA:  Decreased level of consciousness in the colon altered mental status today. Ventriculostomy shunt catheter in place. Initial encounter. EXAM: CT HEAD WITHOUT CONTRAST TECHNIQUE: Contiguous axial images were obtained from the base of the skull through the vertex without intravenous contrast. COMPARISON:  Head CT scan 04/10/2015 and 03/14/2015. FINDINGS: Right parietal approach ventriculostomy shunt catheter is unchanged. There is no hydrocephalus. Densely calcified sellar meningioma or macroadenoma is unchanged. There is atrophy and chronic microvascular  ischemic disease. No evidence of acute intracranial abnormality including hemorrhage, infarct, mass lesion, mass effect, midline shift or abnormal extra-axial fluid collection is identified. No pneumocephalus. Bilateral mastoid effusions are unchanged. No fracture. IMPRESSION: No acute abnormality. Negative for hydrocephalus with a ventriculostomy shunt catheter in place. Atrophy and chronic microvascular ischemic change. Bilateral mastoid effusions, unchanged. Densely calcified suprasellar meningioma, unchanged. Electronically Signed   By: Drusilla Kannerhomas  Dalessio M.D.   On: 04/16/2015 19:32   Dg Chest Port 1 View  04/16/2015  CLINICAL DATA:  Altered mental status. Decreased level consciousness today. Initial encounter. EXAM: PORTABLE CHEST 1 VIEW COMPARISON:  Single view of the chest 04/10/2015 and 03/14/2015. FINDINGS: There is cardiomegaly without edema. Mild atelectasis is present in the right lung base. No pneumothorax or pleural effusion. Remote right rib fractures and surgical neck fracture right humerus are noted. Ventriculostomy shunt catheter is in place. IMPRESSION: Cardiomegaly without acute disease. Electronically Signed   By: Drusilla Kannerhomas  Dalessio M.D.   On: 04/16/2015 19:29    Scheduled Meds: . apixaban  5 mg Oral BID  . aspirin  81 mg Oral Daily  . atorvastatin  10 mg Oral QHS  . cholecalciferol  2,000 Units Oral Daily  . desmopressin  0.1 mg Oral BID  . diltiazem  120 mg Oral Daily  . hydrocortisone  20 mg Oral TID  . levothyroxine  88 mcg Oral QAC breakfast  . modafinil  100 mg Oral Daily  . multivitamin with minerals  1 tablet Oral Daily  . sodium chloride flush  3 mL Intravenous Q12H  . vitamin C  1,000 mg Oral Daily  . vitamin E  400 Units Oral Daily   Continuous Infusions: . sodium chloride 50 mL/hr at 04/18/15 1513    Assessment/Plan:  1. Acute encephalopathy. Could be secondary to dehydration. No source of infection at this time. 2. Paroxysmal atrial fibrillation. On  anticoagulation with Eliquis. Rate control with Cardizem. 3. Panhypopituitarism with macroadenoma on last MRI of the brain.Continue hydrocortisone and levothyroxine 4. Hypothyroidism unspecified continue levothyroxine 5. Hyperlipidemia unspecified continue atorvastatin 6. Elevated troponin likely demand ischemia  Code Status:     Code Status Orders        Start     Ordered   04/17/15 0023  Do not attempt resuscitation (DNR)   Continuous    Question Answer Comment  In the event of cardiac or respiratory ARREST Do not call a "code blue"   In the event of cardiac or respiratory ARREST Do not perform Intubation, CPR, defibrillation or ACLS   In the event of cardiac or respiratory ARREST Use medication by any route, position, wound care, and other measures to relive pain and suffering. May use oxygen, suction and manual treatment of airway obstruction as needed for comfort.      04/17/15 0022    Code Status History    Date Active Date  Inactive Code Status Order ID Comments User Context   04/10/2015 10:35 PM 04/13/2015  5:34 PM DNR [id number]  Shaune Pollack, MD Inpatient   09/07/2014  1:04 AM 09/07/2014  5:15 PM Full Code [id number]  Oralia Manis, MD Inpatient    Advance Directive Documentation        Most Recent Value   Type of Advance Directive  Healthcare Power of Attorney   Pre-existing out of facility DNR order (yellow form or pink MOST form)     "MOST" Form in Place?       Family Communication: family at bedside Disposition Plan: patient will need 24 7 care  Consultants:  Cardiology  Time spent: 35 minutes  Alford Highland  [facility] [place] [facility]

## 2015-04-18 NOTE — Progress Notes (Addendum)
CSW spoke with patient son Natalie Rollins at bedside per Dr Renae GlossWieting request. Family is now looking into 24 hr supervision. Family undecided on PCS vs SNF. CSW provided patient and family with SNF and PCS list. Patient son Natalie Rollins has request for CSW to fax SNF referral. CSW will follow up with family on bed offers.    [name], BSW, MSW, LCSWA Clinical Social Work Dept [phone]

## 2015-04-18 NOTE — Progress Notes (Signed)
Physical Therapy Evaluation Patient Details Name: Natalie Rollins MRN: [id number] DOB: 1929-08-08 Today's Date: 04/18/2015   History of Present Illness  Natalie Rollins is a 82 y.o. female with a known history of proximal atrial fibrillation, meningioma with panhypopituitarism, CVA, hyperlipidemia, arthritis, dementia presented to the emergency room with confusion and change in mental status.  Clinical Impression  Pt with recent admission to hospital and returns with generalized weakness and requires physical assistance for bed mobility, transfers, and ambulation and would benefit from acute PT services to address objective findings.  Pt with decreased tolerance to movement, stating that she "can't", but once movement is initiated pt able to participate and perform mobility with assistance.  Pt able to stand and transfer to recliner from bed with Mod A.  Rec f/u PT services 5x/week.    Follow Up Recommendations SNF    Equipment Recommendations  None recommended by PT (defer to next setting)    Recommendations for Other Services       Precautions / Restrictions Precautions Precautions: Fall Restrictions Weight Bearing Restrictions: No      Mobility  Bed Mobility Overal bed mobility: Needs Assistance Bed Mobility: Supine to Sit     Supine to sit: Max assist     General bed mobility comments: Pt resistant to getting up, but once PT started moving pt, pt was able to assit with transfer.  Increased posterior lean in sitting.  Transfers Overall transfer level: Needs assistance Equipment used: Rolling walker (2 wheeled) Transfers: Sit to/from Stand Sit to Stand: Mod assist;+2 safety/equipment;From elevated surface         General transfer comment: Assist for lift off from bed, able to extend at hips and knees and brace on recliner in standing; pt able to hold static standing for personal hygeine for about 1 min.  Ambulation/Gait Ambulation/Gait assistance: Mod  assist Ambulation Distance (Feet): 3 Feet Assistive device: 1 person hand held assist Gait Pattern/deviations: Step-to pattern;Decreased step length - right;Decreased step length - left;Shuffle     General Gait Details: Pt able to walk short distance from bed to chair.  Stairs            Wheelchair Mobility    Modified Rankin (Stroke Patients Only)       Balance Overall balance assessment: History of Falls;Needs assistance Sitting-balance support: Bilateral upper extremity supported;Feet supported Sitting balance-Leahy Scale: Fair Sitting balance - Comments: posterior lean   Standing balance support: Bilateral upper extremity supported;During functional activity Standing balance-Leahy Scale: Fair                               Pertinent Vitals/Pain Pain Location: Pt grabing sternum with supin to sit transfer, pain decreasing with rest. Pain Intervention(s): Monitored during session;Repositioned    Home Living Family/patient expects to be discharged to:: Private residence Living Arrangements: Spouse/significant other Available Help at Discharge: Family;Available 24 hours/day;Personal care attendant ("home instead")             Additional Comments: Spouse who has been primary caregiver is now ill and in the hospital.    Prior Function Level of Independence: Needs assistance   Gait / Transfers Assistance Needed: Mod A for bed mobilty; able to transfer to wheelchair; ambulates short distances but has been using w/c more at home.  ADL's / Homemaking Assistance Needed: spouse primary caretaker, adult children nearby  Comments: Pt has been using w/c for the past 3-6 months  Hand Dominance        Extremity/Trunk Assessment   Upper Extremity Assessment: Generalized weakness           Lower Extremity Assessment: Generalized weakness      Cervical / Trunk Assessment: Kyphotic  Communication   Communication: HOH;Expressive difficulties   Cognition Arousal/Alertness: Awake/alert Behavior During Therapy: Flat affect Overall Cognitive Status: History of cognitive impairments - at baseline       Memory: Decreased short-term memory              General Comments      Exercises Other Exercises Other Exercises: Able to SLR x 3 reps B LE's, breaking gravity but not able to lift very highHS x 5 B LE's      Assessment/Plan    PT Assessment Patient needs continued PT services  PT Diagnosis Difficulty walking;Generalized weakness;Acute pain   PT Problem List Decreased strength;Decreased activity tolerance;Decreased balance;Decreased mobility;Decreased knowledge of use of DME;Decreased safety awareness;Pain  PT Treatment Interventions DME instruction;Gait training;Functional mobility training;Therapeutic activities;Therapeutic exercise;Balance training;Patient/family education   PT Goals (Current goals can be found in the Care Plan section) Acute Rehab PT Goals Patient Stated Goal: none stated PT Goal Formulation: Patient unable to participate in goal setting Time For Goal Achievement: 05/02/15 Potential to Achieve Goals: Fair    Frequency Min 2X/week   Barriers to discharge Decreased caregiver support husband in hospital    Co-evaluation               End of Session Equipment Utilized During Treatment: Gait belt Activity Tolerance: Patient limited by fatigue;Patient limited by lethargy;Patient limited by pain Patient left: in chair;with call bell/phone within reach;with chair alarm set;with family/visitor present Nurse Communication: Mobility status         Time: 1201-1230 PT Time Calculation (min) (ACUTE ONLY): 29 min   Charges:   PT Evaluation $PT Eval Moderate Complexity: 1 Procedure PT Treatments $Therapeutic Activity: 8-22 mins   PT G Codes:        Jazzie Trampe A Risha Barretta, PT 04/18/2015, 1:12 PM

## 2015-04-18 NOTE — NC FL2 (Signed)
[place] MEDICAID FL2 LEVEL OF CARE SCREENING TOOL     IDENTIFICATION  Patient Name: Natalie Rollins Birthdate: 1929-11-15 Sex: female Admission Date (Current Location): 04/16/2015  [place] and [place] Number:  Chiropodist and Address:  [facility], [address], [place], [place] [address]      Provider Number: [id number]  Attending Physician Name and Address:  Alford Highland, MD  Relative Name and Phone Number:   Brinklee Cisse (spouse) [phone] Hulen Luster [phone])    Current Level of Care: Hospital Recommended Level of Care: Skilled Nursing Facility Prior Approval Number:    Date Approved/Denied: 04/18/15 PASRR Number:  [id number])  Discharge Plan: SNF    Current Diagnoses: Patient Active Problem List   Diagnosis Date Noted  . Elevated troponin 04/18/2015  . Orthostatic hypotension   . History of CVA (cerebrovascular accident)   . Elevated bilirubin   . Hypokalemia 04/10/2015  . Senile purpura (HCC) 03/24/2015  . Right hip pain 11/10/2014  . Chronic pain 09/15/2014  . Allergic rhinitis 09/15/2014  . Atrial fibrillation with RVR (HCC) 09/06/2014  . HLD (hyperlipidemia) 09/06/2014  . Hypothyroidism (acquired) 09/06/2014  . UTI (lower urinary tract infection) 09/06/2014  . Panhypopituitarism (HCC) 09/06/2014  . Paroxysmal atrial fibrillation (HCC) 06/19/2014  . History of stroke 06/19/2014  . Nonverbal 06/19/2014  . Altered mental status 06/19/2014  . History of frequent urinary tract infections 06/19/2014  . AD (Alzheimer's disease) 02/25/2014  . Acquired hypothyroidism 02/25/2014  . 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 Hypotension, iatrogenic 02/25/2014    Orientation RESPIRATION BLADDER Height & Weight     Time, Situation  Normal Continent Weight: 167 lb 12.8 oz (76.114 kg) Height:     BEHAVIORAL SYMPTOMS/MOOD NEUROLOGICAL BOWEL NUTRITION STATUS   Continent Diet (DYS 3)  AMBULATORY STATUS COMMUNICATION OF NEEDS Skin   Extensive Assist Verbally Normal                       Personal Care Assistance Level of Assistance  Bathing, Dressing, Feeding Bathing Assistance: Limited assistance Feeding assistance: Independent Dressing Assistance: Limited assistance     Functional Limitations Info             SPECIAL CARE FACTORS FREQUENCY  PT (By licensed PT)     PT Frequency:  (5X)              Contractures Contractures Info: Not present    Additional Factors Info  Code Status, Allergies Code Status Info:  (DNR) Allergies Info:  (NKA)           Current Medications (04/18/2015):  This is the current hospital active medication list Current Facility-Administered Medications  Medication Dose Route Frequency Provider Last Rate Last Dose  . 0.9 %  sodium chloride infusion   Intravenous Continuous Alford Highland, MD 75 mL/hr at 04/18/15 1456    . acetaminophen (TYLENOL) tablet 650 mg  650 mg Oral Q6H PRN Ihor Austin, MD       Or  . acetaminophen (TYLENOL) suppository 650 mg  650 mg Rectal Q6H PRN Ihor Austin, MD      . acetaminophen (TYLENOL) tablet 650 mg  650 mg Oral Q6H PRN Ihor Austin, MD   650 mg at 04/18/15 1324  . apixaban (ELIQUIS) tablet 5 mg  5 mg Oral BID Ellsworth Lennox, [place]   5 mg at 04/18/15 4132  . aspirin chewable tablet 81 mg  81 mg Oral  Daily Ihor AustinPavan Pyreddy, MD   81 mg at 04/18/15 0937  . atorvastatin (LIPITOR) tablet 10 mg  10 mg Oral QHS Ihor AustinPavan Pyreddy, MD   10 mg at 04/17/15 2227  . cholecalciferol (VITAMIN D) tablet 2,000 Units  2,000 Units Oral Daily Ihor AustinPavan Pyreddy, MD   2,000 Units at 04/18/15 0937  . desmopressin (DDAVP) tablet 0.1 mg  0.1 mg Oral BID Ihor AustinPavan Pyreddy, MD   0.1 mg at 04/18/15 0938  . diltiazem (CARDIZEM CD) 24 hr capsule 120 mg  120 mg Oral Daily Pavan Pyreddy, MD   120 mg at 04/18/15 0937  . hydrocortisone (CORTEF) tablet 20 mg  20 mg Oral TID Ihor AustinPavan Pyreddy, MD   20 mg at  04/18/15 0939  . levothyroxine (SYNTHROID, LEVOTHROID) tablet 88 mcg  88 mcg Oral QAC breakfast Ihor AustinPavan Pyreddy, MD   88 mcg at 04/18/15 0834  . modafinil (PROVIGIL) tablet 100 mg  100 mg Oral Daily Auburn BilberryShreyang Patel, MD   100 mg at 04/18/15 [id number]  . multivitamin with minerals tablet 1 tablet  1 tablet Oral Daily Ihor AustinPavan Pyreddy, MD   1 tablet at 04/18/15 0938  . ondansetron (ZOFRAN) tablet 4 mg  4 mg Oral Q6H PRN Ihor AustinPavan Pyreddy, MD       Or  . ondansetron (ZOFRAN) injection 4 mg  4 mg Intravenous Q6H PRN Pavan Pyreddy, MD      . oxyCODONE (Oxy IR/ROXICODONE) immediate release tablet 5 mg  5 mg Oral Q8H PRN Alford Highlandichard Wieting, MD      . sodium chloride flush (NS) 0.9 % injection 3 mL  3 mL Intravenous Q12H Pavan Pyreddy, MD   3 mL at 04/17/15 2200  . vitamin C (ASCORBIC ACID) tablet 1,000 mg  1,000 mg Oral Daily Ihor AustinPavan Pyreddy, MD   1,000 mg at 04/18/15 0937  . vitamin E capsule 400 Units  400 Units Oral Daily Ihor AustinPavan Pyreddy, MD   400 Units at 04/18/15 [id number]     Discharge Medications: Please see discharge summary for a list of discharge medications.  Relevant Imaging Results:  Relevant Lab Results:   Additional Information  (SSN: [id number])  Starr SinclairSamantha L Litzy Dicker, LCSW

## 2015-04-18 NOTE — Progress Notes (Signed)
Patient Name: Natalie Rollins Date of Encounter: 04/18/2015  Hospital Problem List     Active Problems:   Altered mental status   Atrial fibrillation with RVR (HCC)   History of CVA (cerebrovascular accident)   Elevated troponin   Paroxysmal atrial fibrillation (HCC)   Orthostatic hypotension   82 y.o. female with past medical history of PAF (on Eliquis), HLD, CVA (right basal ganglia in 2013), Hypothyroidism, Pituitary meningioma, dementia and arthritis who presented to [facility] on 04/16/2015 for altered mental status and elevated HR.   Of note, she was recently admitted from 3/24 - 04/13/2015 for weakness and altered mental status. Her bilirubin was found to be elevated that admission and had a positive Murphy sign. Surgery was consulted but dur to her being asymptomatic and a poor surgical candidate, surgical interventions were not undertaken.  Upon arrival of EMS, her HR was elevated into the 190's but improved to the 120's following administration of IV Cardizem.   Overnight on admission (3/30-31) converted to normal sinus rhythm at 9 PM  Subjective   Mental Status seems to be more clear today. - Per son -this is more like her baseline.  Inpatient Medications    . apixaban  5 mg Oral BID  . aspirin  81 mg Oral Daily  . atorvastatin  10 mg Oral QHS  . cholecalciferol  2,000 Units Oral Daily  . desmopressin  0.1 mg Oral BID  . diltiazem  120 mg Oral Daily  . hydrocortisone  20 mg Oral TID  . levothyroxine  88 mcg Oral QAC breakfast  . modafinil  100 mg Oral Daily  . multivitamin with minerals  1 tablet Oral Daily  . sodium chloride flush  3 mL Intravenous Q12H  . vitamin C  1,000 mg Oral Daily  . vitamin E  400 Units Oral Daily    Vital Signs    Filed Vitals:   04/17/15 1143 04/17/15 2017 04/18/15 0622 04/18/15 0830  BP: 130/65 122/64 153/81 158/68  Pulse: 87 73 69 68  Temp: 98 F (36.7 C) 98.8 F (37.1 C) 97.8 F (36.6 C) 98.2 F (36.8 C)  TempSrc:  Oral   Oral  Resp: 18 18 18 20   Weight:      SpO2: 95% 96% 97% 97%    Intake/Output Summary (Last 24 hours) at 04/18/15 0921 Last data filed at 04/18/15 0700  Gross per 24 hour  Intake   1020 ml  Output      0 ml  Net   1020 ml   Filed Weights   04/16/15 1908 04/17/15 0209  Weight: 175 lb 8 oz (79.606 kg) 167 lb 12.8 oz (76.114 kg)    Physical Exam    General: Pleasant, NAD. Neck: supple, no LAN or JVD Neuro: Alert and oriented X person, place & current events . Moves all extremities spontaneously. Psych: Normal affect. HEENT:  NCAT,EOMI Neck: Supple without bruits or JVD. Lungs:  Resp regular and unlabored, CTA. Heart: RRR no s3, s4, or murmurs. Abdomen: Soft, non-tender, non-distended, BS + x 4.  Extremities: No clubbing, cyanosis or edema. DP/PT/Radials 2+ and equal bilaterally.  Labs    CBC  Recent Labs  04/16/15 1906 04/17/15 0337  WBC 12.8* 11.4*  NEUTROABS 9.6*  --   HGB 12.8 12.5  HCT 39.3 38.9  MCV 90.0 90.0  PLT 303 247   Basic Metabolic Panel  Recent Labs  04/16/15 1906 04/17/15 0337  NA 136 139  K 3.7 4.0  CL  103 108  CO2 18* 24  GLUCOSE 185* 83  BUN 7 8  CREATININE 1.33* 0.91  CALCIUM 8.6* 8.2*   Liver Function Tests  Recent Labs  04/16/15 1906  AST 32  ALT 18  ALKPHOS 116  BILITOT 1.3*  PROT 6.1*  ALBUMIN 3.4*   No results for input(s): LIPASE, AMYLASE in the last 72 hours. Cardiac Enzymes  Recent Labs  04/17/15 0337 04/17/15 0731 04/17/15 1352  TROPONINI 0.42* 0.40* 0.24*    Telemetry    NSR 70s  ECG/Echo    Echo 3/31 - poor quality, due to patient refusal.  Available images reveal normal EF & normal wall motion.  No new EKG  Radiology    Reviewed  Assessment & Plan    Active Problems:    Altered mental status - in setting of chronic dementia - unclear of etiology, but Afib RVR may be culprit (if similar situation recurs, would use Amiodarone for rhythm control).      Paroxysmal atrial fibrillation (HCC)/  Atrial fibrillation with RVR (HCC) - converted to NSR after IV Diltiazem  Continue PO Diltiazem for rate control.  Per Dr. Mariah Milling, could consider Amiodarone PO for rhythm control - would prefer to hold off for now & reassess as OP. - want to avoid confusing polypharmacy.  Continue Eliquis - although need to watch for bleeing. (not sure why this was stopped when @ [facility] recently)  (This patients CHA2DS2-VASc Score and unadjusted Ischemic Stroke Rate (% per year) is equal to 7.2 % stroke rate/year from a score of 5 Above score calculated as 1 point each if present [CHF, HTN, DM, Vascular=MI/PAD/Aortic Plaque, Age if 65-74, or Female] Above score calculated as 2 points each if present [Age > 75, or Stroke/TIA/TE]     History of CVA (cerebrovascular accident) - on Eliquis    Elevated troponin - in setting of AMS ? CVA & Afib RVR.  Most consistent with low level demand ischemia from Afib.  Echo is reassuring.  Would not pursue ischemic evaluation     Orthostatic hypotension - would allow for permissive HTN - current BP is acceptable.   Dispo - per Dr. Renae Gloss & her son, with mental status clearing, consider d/c later today or tomorrow.  Will need OP f/u with Dr. Mariah Milling   Signed, Marykay Lex, M.D., M.S.  [facility]  [address] [place], [place] [address] [phone] Fax [phone] ;

## 2015-04-19 LAB — CREATININE, SERUM
Creatinine, Ser: 0.59 mg/dL (ref 0.44–1.00)
GFR calc Af Amer: >60 mL/min (ref >60)

## 2015-04-19 LAB — CBC
HCT: 33.6 % — ABNORMAL LOW (ref 35.0–47.0)
HEMOGLOBIN: 11.1 g/dL — AB (ref 12.0–16.0)
MCH: 29.5 pg (ref 26.0–34.0)
MCHC: 33.2 g/dL (ref 32.0–36.0)
MCV: 88.8 fL (ref 80.0–100.0)
Platelets: 270 10*3/uL (ref 150–440)
RBC: 3.78 MIL/uL — AB (ref 3.80–5.20)
RDW: 16.2 % — ABNORMAL HIGH (ref 11.5–14.5)
WBC: 8 10*3/uL (ref 3.6–11.0)

## 2015-04-19 LAB — T4: T4, Total: 6 ug/dL (ref 4.5–12.0)

## 2015-04-19 LAB — PROLACTIN: PROLACTIN: 6.2 ng/mL (ref 4.8–23.3)

## 2015-04-19 LAB — T3: T3 TOTAL: 35 ng/dL — AB (ref 71–180)

## 2015-04-19 NOTE — Progress Notes (Signed)
Patient ID: Natalie Rollins, female   DOB: 1929-09-23, 82 y.o.   MRN: [id number] [facility] PROGRESS NOTE  Natalie Rollins [id number] DOB: 1929-09-23 DOA: 04/16/2015 PCP: Vonita MossMark Crissman, MD  HPI/Subjective: Patient answers yes or no questions appropriately. Does have some chest pain which seems to be more reproducible.  Objective: Filed Vitals:   04/19/15 0501 04/19/15 1126  BP: 146/76 99/78  Pulse: 86 71  Temp: 98.5 F (36.9 C) 97.7 F (36.5 C)  Resp: 22 24    Filed Weights   04/16/15 1908 04/17/15 0209  Weight: 79.606 kg (175 lb 8 oz) 76.114 kg (167 lb 12.8 oz)    ROS: Review of Systems  Constitutional: Negative for fever and chills.  Eyes: Negative for blurred vision.  Respiratory: Negative for cough and shortness of breath.   Cardiovascular: Positive for chest pain.  Gastrointestinal: Negative for nausea, vomiting, abdominal pain, diarrhea and constipation.  Genitourinary: Negative for dysuria.  Musculoskeletal: Negative for joint pain.  Neurological: Negative for dizziness and headaches.   Exam: Physical Exam  HENT:  Nose: No mucosal edema.  Mouth/Throat: No oropharyngeal exudate or posterior oropharyngeal edema.  Eyes: Conjunctivae, EOM and lids are normal. Pupils are equal, round, and reactive to light.  Neck: No JVD present. Carotid bruit is not present. No edema present. No thyroid mass and no thyromegaly present.  Cardiovascular: S1 normal and S2 normal.  Exam reveals no gallop.   No murmur heard. Pulses:      Dorsalis pedis pulses are 2+ on the right side, and 2+ on the left side.  Respiratory: No respiratory distress. She has no wheezes. She has no rhonchi. She has no rales.  GI: Soft. Bowel sounds are normal. There is no tenderness.  Musculoskeletal:       Right ankle: She exhibits swelling.       Left ankle: She exhibits swelling.  Lymphadenopathy:    She has no cervical adenopathy.  Neurological: She is alert. No cranial  nerve deficit.  Skin: Skin is warm. No rash noted. Nails show no clubbing.  Psychiatric: She has a normal mood and affect.      Data Reviewed: Basic Metabolic Panel:  Recent Labs Lab 04/16/15 1906 04/17/15 0337 04/19/15 0554  NA 136 139  --   K 3.7 4.0  --   CL 103 108  --   CO2 18* 24  --   GLUCOSE 185* 83  --   BUN 7 8  --   CREATININE 1.33* 0.91 0.59  CALCIUM 8.6* 8.2*  --    Liver Function Tests:  Recent Labs Lab 04/16/15 1906  AST 32  ALT 18  ALKPHOS 116  BILITOT 1.3*  PROT 6.1*  ALBUMIN 3.4*   CBC:  Recent Labs Lab 04/16/15 1906 04/17/15 0337 04/19/15 0554  WBC 12.8* 11.4* 8.0  NEUTROABS 9.6*  --   --   HGB 12.8 12.5 11.1*  HCT 39.3 38.9 33.6*  MCV 90.0 90.0 88.8  PLT 303 247 270   Cardiac Enzymes:  Recent Labs Lab 04/12/15 2126 04/16/15 1906 04/17/15 0337 04/17/15 0731 04/17/15 1352  TROPONINI <0.03 0.06* 0.42* 0.40* 0.24*     Recent Results (from the past 240 hour(s))  Urine culture     Status: None   Collection Time: 04/11/15  8:22 AM  Result Value Ref Range Status   Specimen Description URINE, CLEAN CATCH  Final   Special Requests NONE  Final   Culture NO GROWTH 2 DAYS  Final  Report Status 04/13/2015 FINAL  Final     Scheduled Meds: . aspirin  81 mg Oral Daily  . atorvastatin  10 mg Oral QHS  . cholecalciferol  2,000 Units Oral Daily  . desmopressin  0.1 mg Oral BID  . diltiazem  120 mg Oral Daily  . hydrocortisone  20 mg Oral TID  . levothyroxine  88 mcg Oral QAC breakfast  . modafinil  100 mg Oral Daily  . multivitamin with minerals  1 tablet Oral Daily  . sodium chloride flush  3 mL Intravenous Q12H  . vitamin C  1,000 mg Oral Daily  . vitamin E  400 Units Oral Daily   Continuous Infusions: . sodium chloride 50 mL/hr at 04/18/15 1513    Assessment/Plan:  1. Acute encephalopathy. Improved. Could be secondary to dehydration. No source of infection at this time. 2. Paroxysmal atrial fibrillation. On  anticoagulation with Eliquis. Rate control with Cardizem. 3. Panhypopituitarism with macroadenoma on last MRI of the brain.Continue hydrocortisone and levothyroxine 4. Hypothyroidism unspecified continue levothyroxine 5. Hyperlipidemia unspecified continue atorvastatin 6. Elevated troponin likely demand ischemia 7. Chest pain. Reproducible in nature. No further workup.  Code Status:     Code Status Orders        Start     Ordered   04/17/15 0023  Do not attempt resuscitation (DNR)   Continuous    Question Answer Comment  In the event of cardiac or respiratory ARREST Do not call a "code blue"   In the event of cardiac or respiratory ARREST Do not perform Intubation, CPR, defibrillation or ACLS   In the event of cardiac or respiratory ARREST Use medication by any route, position, wound care, and other measures to relive pain and suffering. May use oxygen, suction and manual treatment of airway obstruction as needed for comfort.      04/17/15 0022    Code Status History    Date Active Date Inactive Code Status Order ID Comments User Context   04/10/2015 10:35 PM 04/13/2015  5:34 PM DNR [id number]  Shaune Pollack, MD Inpatient   09/07/2014  1:04 AM 09/07/2014  5:15 PM Full Code [id number]  Oralia Manis, MD Inpatient    Advance Directive Documentation        Most Recent Value   Type of Advance Directive  Healthcare Power of Attorney   Pre-existing out of facility DNR order (yellow form or pink MOST form)     "MOST" Form in Place?       Family Communication: family at bedside Disposition Plan: Likely to rehabilitation tomorrow  Consultants:  Cardiology  Time spent: 22 minutes  Alford Highland  [facility]

## 2015-04-19 NOTE — Progress Notes (Signed)
Per MD Herbie BaltimoreHarding stop eliquis, pt has extensive bruising on her arms

## 2015-04-19 NOTE — Progress Notes (Signed)
Patient Name: Natalie Rollins Date of Encounter: 04/19/2015  Hospital Problem List     Active Problems:   Altered mental status   Atrial fibrillation with RVR (HCC)   History of CVA (cerebrovascular accident)   Elevated troponin   Paroxysmal atrial fibrillation (HCC)   Orthostatic hypotension   82 y.o. female with past medical history of PAF (on Eliquis), HLD, CVA (right basal ganglia in 2013), Hypothyroidism, Pituitary meningioma, dementia and arthritis who presented to [facility] on [facility]16/2015 for altered mental status and elevated HR.   Of note, she was recently admitted from 3/24 - 04/13/2015 for weakness and altered mental status. Her bilirubin was found to be elevated that admission and had a positive Murphy sign. Surgery was consulted but dur to her being asymptomatic and a poor surgical candidate, surgical interventions were not undertaken.  Upon arrival of EMS, her HR was elevated into the 190's but improved to the 120's following administration of IV Cardizem.   Overnight on admission (3/30-31) converted to normal sinus rhythm at 9 PM  Subjective   Mental Status seems to be at baseline. - Per son -this is more like her baseline.. Lots of bruising from Eliquis  Inpatient Medications    . aspirin  81 mg Oral Daily  . atorvastatin  10 mg Oral QHS  . cholecalciferol  2,000 Units Oral Daily  . desmopressin  0.1 mg Oral BID  . diltiazem  120 mg Oral Daily  . hydrocortisone  20 mg Oral TID  . levothyroxine  88 mcg Oral QAC breakfast  . modafinil  100 mg Oral Daily  . multivitamin with minerals  1 tablet Oral Daily  . sodium chloride flush  3 mL Intravenous Q12H  . vitamin C  1,000 mg Oral Daily  . vitamin E  400 Units Oral Daily    Vital Signs    Filed Vitals:   04/18/15 1118 04/18/15 1944 04/19/15 0501 04/19/15 1126  BP: 154/79 129/69 146/76 99/78  Pulse: 76 71 86 71  Temp: 98 F (36.7 C) 98.4 F (36.9 C) 98.5 F (36.9 C) 97.7 F (36.5 C)  TempSrc: Oral Oral   Oral  Resp: 20 20 22 24   Height:      Weight:      SpO2: 99% 92% 90% 99%    Intake/Output Summary (Last 24 hours) at 04/19/15 1231 Last data filed at 04/19/15 1159  Gross per 24 hour  Intake 1837.09 ml  Output    200 ml  Net 1637.09 ml   Filed Weights   04/16/15 1908 04/17/15 0209  Weight: 175 lb 8 oz (79.606 kg) 167 lb 12.8 oz (76.114 kg)    Physical Exam    General: Pleasant, NAD. Neck: supple, no LAN or JVD Neuro: Alert and oriented X person, place & current events . Moves all extremities spontaneously. Psych: Normal affect. HEENT:  NCAT,EOMI Neck: Supple without bruits or JVD. Lungs:  Resp regular and unlabored, CTA. Heart: RRR no s3, s4, or murmurs. Abdomen: Soft, non-tender, non-distended, BS + x 4.  Extremities: No clubbing, cyanosis or edema. DP/PT/Radials 2+ and equal bilaterally.; diffuse ecchymoses bilateral upper arms &oozing from IV sites.  Labs    CBC  Recent Labs  04/16/15 1906 04/17/15 0337 04/19/15 0554  WBC 12.8* 11.4* 8.0  NEUTROABS 9.6*  --   --   HGB 12.8 12.5 11.1*  HCT 39.3 38.9 33.6*  MCV 90.0 90.0 88.8  PLT 303 247 270   Basic Metabolic Panel  Recent Labs  04/16/15 1906 04/17/15 0337 04/19/15 0554  NA 136 139  --   K 3.7 4.0  --   CL 103 108  --   CO2 18* 24  --   GLUCOSE 185* 83  --   BUN 7 8  --   CREATININE 1.33* 0.91 0.59  CALCIUM 8.6* 8.2*  --    Liver Function Tests  Recent Labs  04/16/15 1906  AST 32  ALT 18  ALKPHOS 116  BILITOT 1.3*  PROT 6.1*  ALBUMIN 3.4*   No results for input(s): LIPASE, AMYLASE in the last 72 hours. Cardiac Enzymes  Recent Labs  04/17/15 0337 04/17/15 0731 04/17/15 1352  TROPONINI 0.42* 0.40* 0.24*    Telemetry    NSR 70s  ECG/Echo    Echo 3/31 - poor quality, due to patient refusal.  Available images reveal normal EF & normal wall motion.  No new EKG  Radiology    Reviewed  Assessment & Plan    Active Problems:    Altered mental status - in setting of  chronic dementia - unclear of etiology, but Afib RVR may be culprit (if similar situation recurs, would use Amiodarone for rhythm control).      Paroxysmal atrial fibrillation (HCC)/ Atrial fibrillation with RVR (HCC) - converted to NSR after IV Diltiazem  Continue PO Diltiazem for rate control.  Per Dr. Mariah Milling, could consider Amiodarone PO for rhythm control - would prefer to hold off for now & reassess as OP. - want to avoid confusing polypharmacy.  Continue Eliquis - although need to watch for bleeing. (not sure why this was stopped when @ [facility] recently)  This patients CHA2DS2-VASc Score and unadjusted Ischemic Stroke Rate (% per year) is equal to 7.2 % stroke rate/year from a score of 5     History of CVA (cerebrovascular accident) - has been on Eliquis - will stop Eliquis due to extensive bruising & oozing    Elevated troponin - in setting of AMS ? CVA & Afib RVR.  Most consistent with low level demand ischemia from Afib.  Echo is reassuring.  Would not pursue ischemic evaluation     Orthostatic hypotension - would allow for permissive HTN - current BP is acceptable.   Dispo - per Dr. Renae Gloss & her son, with mental status clearing, consider d/c tomorrow likely to short term SNF / Rehab  Will need OP f/u with Dr. Mariah Milling   Signed, Marykay Lex, M.D., M.S.  [facility]  [address] [place], [place] [address] [phone] Fax [phone] ;

## 2015-04-19 NOTE — Progress Notes (Signed)
ANTICOAGULATION CONSULT NOTE - Follow Up Consult  Pharmacy Consult for Apixaban  Indication: atrial fibrillation  No Known Allergies  Patient Measurements: Height: 5\' 3"  (160 cm) Weight: 167 lb 12.8 oz (76.114 kg) IBW/kg (Calculated) : 52.4 Heparin Dosing Weight:   Vital Signs: Temp: 98.5 F (36.9 C) (04/02 0501) BP: 146/76 mmHg (04/02 0501) Pulse Rate: 86 (04/02 0501)  Labs:  Recent Labs  04/16/15 1906 04/17/15 0337 04/17/15 0544 04/17/15 0731 04/17/15 1352 04/19/15 0554  HGB 12.8 12.5  --   --   --  11.1*  HCT 39.3 38.9  --   --   --  33.6*  PLT 303 247  --   --   --  270  APTT  --   --  35  --   --   --   LABPROT  --   --  15.5*  --   --   --   INR  --   --  1.21  --   --   --   CREATININE 1.33* 0.91  --   --   --  0.59  TROPONINI 0.06* 0.42*  --  0.40* 0.24*  --     Estimated Creatinine Clearance: 52.1 mL/min (by C-G formula based on Cr of 0.59).   Medications:  Prescriptions prior to admission  Medication Sig Dispense Refill Last Dose  . acetaminophen (TYLENOL) 325 MG tablet Take 2 tablets (650 mg total) by mouth every 6 (six) hours as needed for mild pain (or Fever >/= 101).   04/16/2015 at Unknown time  . ALPRAZolam (XANAX) 0.25 MG tablet Take 1 tablet (0.25 mg total) by mouth 2 (two) times daily as needed for anxiety. 30 tablet 0 04/16/2015 at Unknown time  . atorvastatin (LIPITOR) 10 MG tablet Take 10 mg by mouth at bedtime.   04/16/2015 at Unknown time  . Calcium-Magnesium-Zinc 333-133-5 MG TABS Take 1 tablet by mouth daily.    04/16/2015 at Unknown time  . cholecalciferol (VITAMIN D) 1000 units tablet Take 2,000 Units by mouth daily.   04/16/2015 at Unknown time  . desmopressin (DDAVP) 0.1 MG tablet Take 0.1 mg by mouth 2 (two) times daily.   04/16/2015 at Unknown time  . diltiazem (CARDIZEM CD) 120 MG 24 hr capsule Take 120 mg by mouth daily.   04/16/2015 at Unknown time  . glucosamine-chondroitin 500-400 MG tablet Take 1 tablet by mouth 2 (two) times daily.    04/16/2015 at Unknown time  . hydrocortisone (CORTEF) 10 MG tablet Take 20 mg by mouth 3 (three) times daily.   1 04/16/2015 at Unknown time  . levothyroxine (SYNTHROID, LEVOTHROID) 88 MCG tablet Take 88 mcg by mouth daily before breakfast.   04/16/2015 at Unknown time  . Multiple Vitamin (MULTIVITAMIN WITH MINERALS) TABS tablet Take 1 tablet by mouth daily.   04/16/2015 at Unknown time  . vitamin C (ASCORBIC ACID) 500 MG tablet Take 1,000 mg by mouth daily.   04/16/2015 at Unknown time  . vitamin E 400 UNIT capsule Take 400 Units by mouth daily.    04/16/2015 at Unknown time  . apixaban (ELIQUIS) 5 MG TABS tablet Take 5 mg by mouth 2 (two) times daily. Reported on 04/16/2015   Not Taking at Unknown time   Scheduled:  . apixaban  5 mg Oral BID  . aspirin  81 mg Oral Daily  . atorvastatin  10 mg Oral QHS  . cholecalciferol  2,000 Units Oral Daily  . desmopressin  0.1 mg Oral BID  .  diltiazem  120 mg Oral Daily  . hydrocortisone  20 mg Oral TID  . levothyroxine  88 mcg Oral QAC breakfast  . modafinil  100 mg Oral Daily  . multivitamin with minerals  1 tablet Oral Daily  . sodium chloride flush  3 mL Intravenous Q12H  . vitamin C  1,000 mg Oral Daily  . vitamin E  400 Units Oral Daily    Assessment: Pharmacy consulted to dose and monitor apixaban therapy in this 82 year old female.  Goal of Therapy:      Plan:  Will continue apixaban 5 mg PO BID. Pharmacy to follow and monitor per protocol   Emberlie Gotcher D 04/19/2015,10:28 AM

## 2015-04-20 LAB — T4: T4 TOTAL: 6.2 ug/dL (ref 4.5–12.0)

## 2015-04-20 LAB — CREATININE, SERUM
Creatinine, Ser: 0.55 mg/dL (ref 0.44–1.00)
GFR calc Af Amer: >60 mL/min (ref >60)
GFR calc non Af Amer: >60 mL/min (ref >60)

## 2015-04-20 LAB — CBC
HEMATOCRIT: 33.6 % — AB (ref 35.0–47.0)
Hemoglobin: 11.3 g/dL — ABNORMAL LOW (ref 12.0–16.0)
MCH: 29.6 pg (ref 26.0–34.0)
MCHC: 33.7 g/dL (ref 32.0–36.0)
MCV: 88 fL (ref 80.0–100.0)
Platelets: 278 10*3/uL (ref 150–440)
RBC: 3.82 MIL/uL (ref 3.80–5.20)
RDW: 15.9 % — AB (ref 11.5–14.5)
WBC: 6.9 10*3/uL (ref 3.6–11.0)

## 2015-04-20 LAB — T3, FREE: T3 FREE: 1.4 pg/mL — AB (ref 2.0–4.4)

## 2015-04-20 MED ORDER — MODAFINIL 100 MG PO TABS: 100.0000 mg | ORAL_TABLET | Freq: Every day | ORAL | Status: AC

## 2015-04-20 MED ORDER — ALPRAZOLAM 0.25 MG PO TABS
0.2500 mg | ORAL_TABLET | Freq: Two times a day (BID) | ORAL | Status: AC | PRN
Start: 2015-04-20 — End: ?

## 2015-04-20 MED ORDER — OXYCODONE HCL 5 MG PO TABS: 5.0000 mg | ORAL_TABLET | Freq: Three times a day (TID) | ORAL | Status: AC | PRN

## 2015-04-20 MED ORDER — DILTIAZEM HCL ER COATED BEADS 180 MG PO CP24: 180.0000 mg | ORAL_CAPSULE | Freq: Every day | ORAL | Status: AC

## 2015-04-20 MED ORDER — DILTIAZEM HCL ER COATED BEADS 180 MG PO CP24
180.0000 mg | ORAL_CAPSULE | Freq: Every day | ORAL | Status: DC
Start: 2015-04-20 — End: 2015-04-20
  Administered 2015-04-20: 180 mg via ORAL
  Filled 2015-04-20: qty 1

## 2015-04-20 NOTE — Care Management Important Message (Signed)
Important Message  Patient Details  Name: Fonnie Muancy Stevens Noller MRN: [id number] Date of Birth: 03-22-1929   Medicare Important Message Given:  Yes    Olegario MessierKathy A Zeyna Mkrtchyan 04/20/2015, 10:29 AM

## 2015-04-20 NOTE — Progress Notes (Signed)
CSW was informed by RN Case Manager that patient's son wanted to discuss discharge plans. Stated that he's interested in patient going to [facility] at discharge. CSW sent SNF referral to [facility] through the HUB. CSW contacted Bill in admissions at [facility] and requested he reviewed patient's referral. Bill reported he'd call CSW back if he can make a bed offer. CSW informed MD Wieting of above. CSW will continue to follow and assist.   Woodroe Modehristina Riddhi Grether, MSW, LCSW-A Clinical Social Work Department [phone]

## 2015-04-20 NOTE — Progress Notes (Signed)
   SUBJECTIVE:  82 y.o. female with past medical history of PAF (on Eliquis), HLD, CVA (right basal ganglia in 2013), Hypothyroidism, Pituitary meningioma, dementia and arthritis who presented to [facility] on 04/16/2015 for altered mental status and elevated HR due to A-fib with RVR. Marland Kitchen.  She converted to NSR 2 nights ago.  She has been confused. She reports continued chest pain which seems to be reproducible. She is overall a very poor historian.   Filed Vitals:   04/19/15 0501 04/19/15 1126 04/19/15 2046 04/20/15 0443  BP: 146/76 99/78 119/59 138/70  Pulse: 86 71 64 66  Temp: 98.5 F (36.9 C) 97.7 F (36.5 C) 98 F (36.7 C) 98.3 F (36.8 C)  TempSrc:  Oral Oral Oral  Resp: 22 24 16 22   Height:      Weight:      SpO2: 90% 99% 95% 93%    Intake/Output Summary (Last 24 hours) at 04/20/15 0816 Last data filed at 04/19/15 1839  Gross per 24 hour  Intake 460.83 ml  Output    400 ml  Net  60.83 ml    LABS: Basic Metabolic Panel:  Recent Labs  16/10/9602/02/17 0554 04/20/15 0500  CREATININE 0.59 0.55   Liver Function Tests: No results for input(s): AST, ALT, ALKPHOS, BILITOT, PROT, ALBUMIN in the last 72 hours. No results for input(s): LIPASE, AMYLASE in the last 72 hours. CBC:  Recent Labs  04/19/15 0554 04/20/15 0500  WBC 8.0 6.9  HGB 11.1* 11.3*  HCT 33.6* 33.6*  MCV 88.8 88.0  PLT 270 278   Cardiac Enzymes:  Recent Labs  04/17/15 1352  TROPONINI 0.24*   BNP: Invalid input(s): POCBNP D-Dimer: No results for input(s): DDIMER in the last 72 hours. Hemoglobin A1C: No results for input(s): HGBA1C in the last 72 hours. Fasting Lipid Panel: No results for input(s): CHOL, HDL, LDLCALC, TRIG, CHOLHDL, LDLDIRECT in the last 72 hours. Thyroid Function Tests:  Recent Labs  04/18/15 0738  TSH 0.122*  T4TOTAL 6.0   Anemia Panel: No results for input(s): VITAMINB12, FOLATE, FERRITIN, TIBC, IRON, RETICCTPCT in the last 72 hours.   PHYSICAL EXAM General: Well  developed, well nourished, in no acute distress HEENT:  Normocephalic and atramatic Neck:  No JVD.  Lungs: Clear bilaterally to auscultation and percussion. Heart: HRRR . Normal S1 and S2 without gallops or murmurs.  Abdomen: Bowel sounds are positive, abdomen soft and non-tender  Msk:  Back normal, normal gait. Normal strength and tone for age. Extremities: No clubbing, cyanosis or edema.   Neuro: Confused and not oriented.   TELEMETRY: Reviewed telemetry pt in sinus rhythm with frequent PACs  ASSESSMENT AND PLAN:  1. Paroxysmal atrial fibrillation: She is back in normal sinus rhythm. I increased the dose of diltiazem extended release 180 and a gram once daily. She has been on anticoagulation with Eliquis but this has been discontinued this admission due to extensive bruising and oozing according to the notes. She is at high risk for thromboembolic complications without anticoagulation.  2. altered mental status: Likely underlying dementia.  3. Elevated troponin: Likely due to supply demand ischemia due to tachycardia. The patient has reproducible chest pain and overall difficult to evaluate her symptoms given her underlying dementia.  Lorine BearsMuhammad Martena Emanuele, MD, [facility] 04/20/2015 8:16 AM

## 2015-04-20 NOTE — Progress Notes (Signed)
CSW presented bed offers to patient's son Marcial Pacasimothy. Accepted bed [facility]. CSW called [facility] in [facility] to inform him that patient accepted bed offer. Clinical Social Worker was informed that patient will be medically ready to discharge to [facility]. Patient and her son- Marcial Pacasimothy in a agreement with plan. CSW called [facility] to confirm that patient's bed is ready. Provided patient's room number D-3 and number to call for report Olegario MessierKathy [phone] . All discharge information faxed to [facility]. DNR added to discharge packet.   Call to patient's son- Marcial Pacasimothy, to inform him [facility] would [facility]. RN will call report and [facility] will [facility].  Woodroe Modehristina Micai Apolinar, MSW, LCSW-A Clinical Social Work Department [phone]

## 2015-04-20 NOTE — Clinical Social Work Placement (Signed)
   CLINICAL SOCIAL WORK PLACEMENT  NOTE  Date:  04/20/2015  Patient Details  Name: Natalie Rollins MRN: [id number] Date of Birth: 1929/10/12  Clinical Social Work is seeking post-discharge placement for this patient at the Skilled  Nursing Facility level of care (*CSW will initial, date and re-position this form in  chart as items are completed):  Yes   Patient/family provided with Central Clinical Social Work Department's list of facilities offering this level of care within the geographic area requested by the patient (or if unable, by the patient's family).  Yes   Patient/family informed of their freedom to choose among providers that offer the needed level of care, that participate in Medicare, Medicaid or managed care program needed by the patient, have an available bed and are willing to accept the patient.  Yes   Patient/family informed of [facility]'s ownership interest in [facility] and [facility], as well as of the fact that they are under no obligation to receive care at these facilities.  PASRR submitted to EDS on       PASRR number received on       Existing PASRR number confirmed on 04/20/15     FL2 transmitted to all facilities in geographic area requested by pt/family on 04/20/15     FL2 transmitted to all facilities within larger geographic area on       Patient informed that his/her managed care company has contracts with or will negotiate with certain facilities, including the following:        Yes   Patient/family informed of bed offers received.  Patient chooses bed at  [facility] )     Physician recommends and patient chooses bed at      Patient to be transferred to  [facility]) on 04/20/15.  Patient to be transferred to facility by  [facility] )     Patient family notified on 04/20/15 of transfer.  Name of family member notified:    Marcial Pacas(Timothy- Son)     PHYSICIAN       Additional Comment:     _______________________________________________ Idamae Lusherhristina E Lauriana Denes, LCSW 04/20/2015, 11:18 AM

## 2015-04-20 NOTE — Discharge Instructions (Signed)
Confusion Confusion is the inability to think with your usual speed or clarity. Confusion may come on quickly or slowly over time. How quickly the confusion comes on depends on the cause. Confusion can be due to any number of causes. CAUSES   Concussion, head injury, or head trauma.  Seizures.  Stroke.  Fever.  Brain tumor.  Age related decreased brain function (dementia).  Heightened emotional states like rage or terror.  Mental illness in which the person loses the ability to determine what is real and what is not (hallucinations).  Infections such as a urinary tract infection (UTI).  Toxic effects from alcohol, drugs, or prescription medicines.  Dehydration and an imbalance of salts in the body (electrolytes).  Lack of sleep.  Low blood sugar (diabetes).  Low levels of oxygen from conditions such as chronic lung disorders.  Drug interactions or other medicine side effects.  Nutritional deficiencies, especially niacin, thiamine, vitamin C, or vitamin B.  Sudden drop in body temperature (hypothermia).  Change in routine, such as when traveling or hospitalized. SIGNS AND SYMPTOMS  People often describe their thinking as cloudy or unclear when they are confused. Confusion can also include feeling disoriented. That means you are unaware of where or who you are. You may also not know what the date or time is. If confused, you may also have difficulty paying attention, remembering, and making decisions. Some people also act aggressively when they are confused.  DIAGNOSIS  The medical evaluation of confusion may include:  Blood and urine tests.  X-rays.  Brain and nervous system tests.  Analyzing your brain waves (electroencephalogram or EEG).  Magnetic resonance imaging (MRI) of your head.  Computed tomography (CT) scan of your head.  Mental status tests in which your health care provider may ask many questions. Some of these questions may seem silly or strange,  but they are a very important test to help diagnose and treat confusion. TREATMENT  An admission to the hospital may not be needed, but a person with confusion should not be left alone. Stay with a family member or friend until the confusion clears. Avoid alcohol, pain relievers, or sedative drugs until you have fully recovered. Do not drive until directed by your health care provider. HOME CARE INSTRUCTIONS  What family and friends can do:  To find out if someone is confused, ask the person to state his or her name, age, and the date. If the person is unsure or answers incorrectly, he or she is confused.  Always introduce yourself, no matter how well the person knows you.  Often remind the person of his or her location.  Place a calendar and clock near the confused person.  Help the person with his or her medicines. You may want to use a pill box, an alarm as a reminder, or give the person each dose as prescribed.  Talk about current events and plans for the day.  Try to keep the environment calm, quiet, and peaceful.  Make sure the person keeps follow-up visits with his or her health care provider. PREVENTION  Ways to prevent confusion:  Avoid alcohol.  Eat a balanced diet.  Get enough sleep.  Take medicine only as directed by your health care provider.  Do not become isolated. Spend time with other people and make plans for your days.  Keep careful watch on your blood sugar levels if you are diabetic. SEEK IMMEDIATE MEDICAL CARE IF:   You develop severe headaches, repeated vomiting, seizures, blackouts, or   slurred speech.  There is increasing confusion, weakness, numbness, restlessness, or personality changes.  You develop a loss of balance, have marked dizziness, feel uncoordinated, or fall.  You have delusions, hallucinations, or develop severe anxiety.  Your family members think you need to be rechecked.   This information is not intended to replace advice given  to you by your health care provider. Make sure you discuss any questions you have with your health care provider.   Document Released: 02/11/2004 Document Revised: 01/24/2014 Document Reviewed: 02/08/2013 Elsevier Interactive Patient Education 2016 [employer]  

## 2015-04-20 NOTE — Discharge Summary (Signed)
[facility] - [place] at [facility]   PATIENT NAME: Natalie Rollins    MR#:  [id number]  DATE OF BIRTH:  [birth year]  DATE OF ADMISSION:  04/16/2015 ADMITTING PHYSICIAN: Ihor Austin, MD  DATE OF DISCHARGE: 04/20/2015  PRIMARY CARE PHYSICIAN: Vonita Moss, MD    ADMISSION DIAGNOSIS:  Altered mental status, unspecified altered mental status type [R41.82]  DISCHARGE DIAGNOSIS:  Active Problems:   Paroxysmal atrial fibrillation (HCC)   Altered mental status   Atrial fibrillation with RVR (HCC)   Orthostatic hypotension   History of CVA (cerebrovascular accident)   Elevated troponin   SECONDARY DIAGNOSIS:   Past Medical History  Diagnosis Date  . PAF (paroxysmal atrial fibrillation) (HCC)     a. unknown if PAF vs persistent at this time given her travel history; b. on ; c. history of post op a-fib 07/2010  . Pituitary mass (HCC)     a. 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 History of stroke x 2    a. left internal capsule CVA 07/2010; b. right stroke in right basal ganglia 2013  . HLD (hyperlipidemia)   . Arthritis     a. s/p lumbar fusion c/b broken hardware w/ removal of the fusion & subsequent revision most recently 07/2010  . Recurrent UTI   . Hydrocephalus     has a shunt placed   . Stroke (HCC)     x 2  . Dementia   . Allergy     HOSPITAL COURSE:   1. Acute encephalopathy. Improved, could be secondary to dehydration. No source of infection. Underlying dementia. Admitting physician put on Provigil which helped 2. Paroxysmal atrial fibrillation. Eliquis was was stopped secondary to bruising and oozing. Aspirin only for anticoagulation. Higher risk of stroke without major blood thinner. Cardizem for rate control. Patient currently in normal sinus rhythm. 3. 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 Continue hydrocortisone and levothyroxine 4. Hypothyroidism unspecified  continue levothyroxin 5. Hyperlipidemia unspecified continue atorvastatin 6. Elevated troponin likely demand ischemia 7. Chest pain which is reproducible nature. Seen by cardiology and no further workup needed. Pain medication as needed for pain. Please try Tylenol first.  DISCHARGE CONDITIONS:   Fair  CONSULTS OBTAINED:  Treatment Team:  Antonieta Iba, MD  DRUG ALLERGIES:  No Known Allergies  DISCHARGE MEDICATIONS:   Current Discharge Medication List    START taking these medications   Details  modafinil (PROVIGIL) 100 MG tablet Take 1 tablet (100 mg total) by mouth daily. Qty: 30 tablet, Refills: 0    oxyCODONE (OXY IR/ROXICODONE) 5 MG immediate release tablet Take 1 tablet (5 mg total) by mouth every 8 (eight) hours as needed for moderate pain or severe pain. Qty: 30 tablet, Refills: 0      CONTINUE these medications which have CHANGED   Details  ALPRAZolam (XANAX) 0.25 MG tablet Take 1 tablet (0.25 mg total) by mouth 2 (two) times daily as needed for anxiety. Qty: 30 tablet, Refills: 0    diltiazem (CARDIZEM CD) 180 MG 24 hr capsule Take 1 capsule (180 mg total) by mouth daily. Qty: 30 capsule      CONTINUE these medications which have NOT CHANGED   Details  acetaminophen (TYLENOL) 325 MG tablet Take 2 tablets (650 mg total) by mouth every 6 (six) hours as needed for mild pain (or Fever >/= 101).    atorvastatin (LIPITOR) 10 MG tablet Take 10 mg by mouth at  bedtime.    Calcium-Magnesium-Zinc 333-133-5 MG TABS Take 1 tablet by mouth daily.     cholecalciferol (VITAMIN D) 1000 units tablet Take 2,000 Units by mouth daily.    desmopressin (DDAVP) 0.1 MG tablet Take 0.1 mg by mouth 2 (two) times daily.    glucosamine-chondroitin 500-400 MG tablet Take 1 tablet by mouth 2 (two) times daily.    hydrocortisone (CORTEF) 10 MG tablet Take 20 mg by mouth 3 (three) times daily.  Refills: 1    levothyroxine (SYNTHROID, LEVOTHROID) 88 MCG tablet Take 88 mcg by mouth  daily before breakfast.    Multiple Vitamin (MULTIVITAMIN WITH MINERALS) TABS tablet Take 1 tablet by mouth daily.    vitamin C (ASCORBIC ACID) 500 MG tablet Take 1,000 mg by mouth daily.    vitamin E 400 UNIT capsule Take 400 Units by mouth daily.       STOP taking these medications     apixaban (ELIQUIS) 5 MG TABS tablet          DISCHARGE INSTRUCTIONS:   Follow up with doctor at rehabilitation in 1 day  If you experience worsening of your admission symptoms, develop shortness of breath, life threatening emergency, suicidal or homicidal thoughts you must seek medical attention immediately by calling 911 or calling your MD immediately  if symptoms less severe.  You Must read complete instructions/literature along with all the possible adverse reactions/side effects for all the Medicines you take and that have been prescribed to you. Take any new Medicines after you have completely understood and accept all the possible adverse reactions/side effects.   Please note  You were cared for by a hospitalist during your hospital stay. If you have any questions about your discharge medications or the care you received while you were in the hospital after you are discharged, you can call the unit and asked to speak with the hospitalist on call if the hospitalist that took care of you is not available. Once you are discharged, your primary care physician will handle any further medical issues. Please note that NO REFILLS for any discharge medications will be authorized once you are discharged, as it is imperative that you return to your primary care physician (or establish a relationship with a primary care physician if you do not have one) for your aftercare needs so that they can reassess your need for medications and monitor your lab values.    Today   CHIEF COMPLAINT:   Chief Complaint  Patient presents with  . Altered Mental Status  . Atrial Fibrillation    HISTORY OF PRESENT  ILLNESS:  Natalie Rollins  is a 82 y.o. female presented with altered mental status and rapid atrial fibrillation   VITAL SIGNS:  Blood pressure 138/70, pulse 66, temperature 98.3 F (36.8 C), temperature source Oral, resp. rate 22, height 5\' 3"  (1.6 m), weight 76.114 kg (167 lb 12.8 oz), SpO2 93 %.  I/O:    Intake/Output Summary (Last 24 hours) at 04/20/15 0955 Last data filed at 04/20/15 0900  Gross per 24 hour  Intake 580.83 ml  Output    400 ml  Net 180.83 ml    PHYSICAL EXAMINATION:  GENERAL:  82 y.o.-year-old patient lying in the bed with no acute distress.  EYES: Pupils equal, round, reactive to light and accommodation. No scleral icterus. Extraocular muscles intact.  HEENT: Head atraumatic, normocephalic. Oropharynx and nasopharynx clear.  NECK:  Supple, no jugular venous distention. No thyroid enlargement, no tenderness.  LUNGS: Normal breath sounds  bilaterally, no wheezing, rales,rhonchi or crepitation. No use of accessory muscles of respiration.  CARDIOVASCULAR: S1, S2 normal. 3/6 systolic murmurs, no rubs, or gallops. Reproducible chest pain over the sternum ABDOMEN: Soft, non-tender, non-distended. Bowel sounds present. No organomegaly or mass.  EXTREMITIES: 2+ edema, no cyanosis, or clubbing.  NEUROLOGIC: Patient alert and answers questions. PSYCHIATRIC: The patient is alert.  SKIN: Bruising on arms  DATA REVIEW:   CBC  Recent Labs Lab 04/20/15 0500  WBC 6.9  HGB 11.3*  HCT 33.6*  PLT 278    Chemistries   Recent Labs Lab 04/16/15 1906 04/17/15 0337  04/20/15 0500  NA 136 139  --   --   K 3.7 4.0  --   --   CL 103 108  --   --   CO2 18* 24  --   --   GLUCOSE 185* 83  --   --   BUN 7 8  --   --   CREATININE 1.33* 0.91  < > 0.55  CALCIUM 8.6* 8.2*  --   --   AST 32  --   --   --   ALT 18  --   --   --   ALKPHOS 116  --   --   --   BILITOT 1.3*  --   --   --   < > = values in this interval not displayed.  Cardiac Enzymes  Recent Labs Lab  04/17/15 1352  TROPONINI 0.24*    Microbiology Results  Results for orders placed or performed during the hospital encounter of 04/10/15  Urine culture     Status: None   Collection Time: 04/11/15  8:22 AM  Result Value Ref Range Status   Specimen Description URINE, CLEAN CATCH  Final   Special Requests NONE  Final   Culture NO GROWTH 2 DAYS  Final   Report Status 04/13/2015 FINAL  Final    Management plans discussed with the patient Today and son yesterday and they are in agreement.  CODE STATUS:     Code Status Orders        Start     Ordered   04/17/15 0023  Do not attempt resuscitation (DNR)   Continuous    Question Answer Comment  In the event of cardiac or respiratory ARREST Do not call a "code blue"   In the event of cardiac or respiratory ARREST Do not perform Intubation, CPR, defibrillation or ACLS   In the event of cardiac or respiratory ARREST Use medication by any route, position, wound care, and other measures to relive pain and suffering. May use oxygen, suction and manual treatment of airway obstruction as needed for comfort.      04/17/15 0022    Code Status History    Date Active Date Inactive Code Status Order ID Comments User Context   04/10/2015 10:35 PM 04/13/2015  5:34 PM DNR [id number]  Shaune Pollack, MD Inpatient   09/07/2014  1:04 AM 09/07/2014  5:15 PM Full Code [id number]  Oralia Manis, MD Inpatient    Advance Directive Documentation        Most Recent Value   Type of Advance Directive  Healthcare Power of Attorney   Pre-existing out of facility DNR order (yellow form or pink MOST form)     "MOST" Form in Place?        TOTAL TIME TAKING CARE OF THIS PATIENT: 32 minutes.    Alford Highland M.D on 04/20/2015 at 9:55 AM  Between 7am to 6pm - Pager - [phone]  After 6pm go to [email or web address] - password [email or web address] [facility]  [place] [facility]  Office  [phone]  CC: Primary care physician; Vonita Moss, MD

## 2015-04-20 NOTE — Progress Notes (Signed)
[facility], pt will transport via EMS, IV Site DCd, bleeding controlled, tele turned in, son was informed by LSW that patient would be leaving for [place].

## 2015-04-21 NOTE — Progress Notes (Signed)
[facility] -  at [facility]                                                                                                                                                                                            Patient Demographics   Natalie Rollins, is a 82 y.o. female, DOB - 07/22/1929, ZOX:[id number]  Admit date - 04/17/2015   Admitting Physician No admitting provider for patient encounter.  Outpatient Primary MD for the patient is Vonita Moss, MD   LOS - 0  Subjective: Patient admited with altered mental status and afib with rvr, she is currntly drowsy but arousable, she is confused    Review of Systems:   CONSTITUTIONAL:unable to provide due to mental status  Vitals:   There were no vitals filed for this visit.  Wt Readings from Last 3 Encounters:  04/17/15 76.114 kg (167 lb 12.8 oz)  04/10/15 77.111 kg (170 lb)  04/08/15 77.111 kg (170 lb)    No intake or output data in the 24 hours ending 04/21/15 1038  Physical Exam:   GENERAL: Pleasant-appearing in no apparent distress.  HEAD, EYES, EARS, NOSE AND THROAT: Atraumatic, normocephalic. Extraocular muscles are intact. Pupils equal and reactive to light. Sclerae anicteric. No conjunctival injection. No oro-pharyngeal erythema.  NECK: Supple. There is no jugular venous distention. No bruits, no lymphadenopathy, no thyromegaly.  HEART: irrgular ,irrgular No murmurs, no rubs, no clicks.  LUNGS: Clear to auscultation bilaterally. No rales or rhonchi. No wheezes.  ABDOMEN: Soft, flat, nontender, nondistended. Has good bowel sounds. No hepatosplenomegaly appreciated.  EXTREMITIES: No evidence of any cyanosis, clubbing, or peripheral edema.  +2 pedal and radial pulses bilaterally.  NEUROLOGIC:  Sleepy but arousable SKIN: Moist and warm with no rashes appreciated.  Psych: Not anxious, depressed LN: No inguinal LN enlargement    Antibiotics   Anti-infectives    None      Medications    Scheduled Meds: Continuous Infusions: PRN Meds:.   Data Review:   Micro Results No results found for this or any previous visit (from the past 240 hour(s)).  Radiology Reports Dg Chest 1 View  04/10/2015  CLINICAL DATA:  Weakness.  Found unresponsive today. EXAM: CHEST 1 VIEW COMPARISON:  03/14/2015 FINDINGS: VP shunt tubing is again noted in the lower right neck and right chest. Cardiomediastinal silhouette is unchanged. Slight elevation of the right hemidiaphragm is unchanged. No airspace consolidation, edema, pleural effusion, or pneumothorax is identified. Sequelae of prior thoracolumbar posterior fusion are again identified with grossly similar appearance of bilateral interconnecting rod disruption. An old right humeral neck fracture is noted. IMPRESSION: No  active disease. Electronically Signed   By: Sebastian Ache M.D.   On: 04/10/2015 19:59   Ct Head Wo Contrast  04/16/2015  CLINICAL DATA:  Decreased level of consciousness in the colon altered mental status today. Ventriculostomy shunt catheter in place. Initial encounter. EXAM: CT HEAD WITHOUT CONTRAST TECHNIQUE: Contiguous axial images were obtained from the base of the skull through the vertex without intravenous contrast. COMPARISON:  Head CT scan 04/10/2015 and 03/14/2015. FINDINGS: Right parietal approach ventriculostomy shunt catheter is unchanged. There is no hydrocephalus. Densely calcified sellar meningioma or macroadenoma is unchanged. There is atrophy and chronic microvascular ischemic disease. No evidence of acute intracranial abnormality including hemorrhage, infarct, mass lesion, mass effect, midline shift or abnormal extra-axial fluid collection is identified. No pneumocephalus. Bilateral mastoid effusions are unchanged. No fracture. IMPRESSION: No acute abnormality. Negative for hydrocephalus with a ventriculostomy shunt catheter in place. Atrophy and chronic microvascular ischemic change. Bilateral mastoid effusions,  unchanged. Densely calcified suprasellar meningioma, unchanged. Electronically Signed   By: Drusilla Kanner M.D.   On: 04/16/2015 19:32   Ct Head Wo Contrast  04/10/2015  CLINICAL DATA:  Altered mental status EXAM: CT HEAD WITHOUT CONTRAST TECHNIQUE: Contiguous axial images were obtained from the base of the skull through the vertex without intravenous contrast. COMPARISON:  03/14/2015 FINDINGS: No intracranial hemorrhage, mass effect or midline shift. Chronic opacification of the left sphenoid sinus again noted. Again noted fluid and opacification of left mastoid air cells. The maxillary sinuses are unremarkable. Again noted sellar region meningioma which is unchanged from prior exam. Stable VP shunt catheter position. There is no intraventricular hemorrhage. Ventricular size is stable from prior exam. Again noted cerebral atrophy and periventricular chronic white matter disease. Stable lacunar infarcts in basal ganglia. Stable patchy subcortical chronic white matter decreased attenuation. There is improvement in previous subdural hematomas. The right subdural hematoma has resolved. Only minimal left subdural hematoma with improvement from prior exam measures 3 mm thickness decreased from prior exam. There is no evidence of acute hemorrhage within this chronic appearing hematoma. No definite acute cortical infarction. IMPRESSION: 1. Significant improvement in previous bilateral subdural hematoma. The right subdural hematoma has resolved. Only tiny residual left subdural chronic hematoma measures 3 mm thickness. No evidence of acute hemorrhage or blood products. 2. Again noted VP shunt catheter. Ventricular size is stable from prior exam. 3. Stable atrophy and chronic white matter disease. Stable small lacunar infarcts bilateral basal ganglia. No definite acute cortical infarction 4. Stable meningioma in suprasellar region. Electronically Signed   By: Natasha Mead M.D.   On: 04/10/2015 20:03   Dg Chest Port 1  View  04/16/2015  CLINICAL DATA:  Altered mental status. Decreased level consciousness today. Initial encounter. EXAM: PORTABLE CHEST 1 VIEW COMPARISON:  Single view of the chest 04/10/2015 and 03/14/2015. FINDINGS: There is cardiomegaly without edema. Mild atelectasis is present in the right lung base. No pneumothorax or pleural effusion. Remote right rib fractures and surgical neck fracture right humerus are noted. Ventriculostomy shunt catheter is in place. IMPRESSION: Cardiomegaly without acute disease. Electronically Signed   By: Drusilla Kanner M.D.   On: 04/16/2015 19:29   US Abdomen Limited Ruq  04/11/2015  CLINICAL DATA:  82 year old female with a history of elevated bilirubin EXAM: US ABDOMEN LIMITED - RIGHT UPPER QUADRANT COMPARISON:  CT 11/27/2012 FINDINGS: Gallbladder: Partial visualization of the gallbladder secondary to overlying bowel gas, although there is evidence of cholelithiasis. Gallbladder wall measures 2 mm. Sonographic Eulah Pont sign is reported positive. Common bile  duct: Diameter: 2 mm -3 mm. Liver: Heterogeneous appearance of the liver parenchyma. IMPRESSION: Cholelithiasis, with sonographic Murphy sign reported as positive, suggesting acute cholecystitis. There is overlying bowel, evident on the current sonographic survey and prior CT, which may be confounding the Murphy sign. If there is any question of an alternative etiology, confirmatory testing with contrast-enhanced CT or nuclear medicine study may be considered. Signed, Yvone Neu. Loreta Ave, DO [facility] Radiology Electronically Signed   By: Gilmer Mor D.O.   On: 04/11/2015 09:19     CBC  Recent Labs Lab 04/16/15 1906 04/17/15 0337 04/19/15 0554 04/20/15 0500  WBC 12.8* 11.4* 8.0 6.9  HGB 12.8 12.5 11.1* 11.3*  HCT 39.3 38.9 33.6* 33.6*  PLT 303 247 270 278  MCV 90.0 90.0 88.8 88.0  MCH 29.4 28.9 29.5 29.6  MCHC 32.6 32.2 33.2 33.7  RDW 16.4* 16.5* 16.2* 15.9*   LYMPHSABS 1.9  --   --   --   MONOABS 1.2*  --   --   --   EOSABS 0.1  --   --   --   BASOSABS 0.1  --   --   --     Chemistries   Recent Labs Lab 04/16/15 1906 04/17/15 0337 04/19/15 0554 04/20/15 0500  NA 136 139  --   --   K 3.7 4.0  --   --   CL 103 108  --   --   CO2 18* 24  --   --   GLUCOSE 185* 83  --   --   BUN 7 8  --   --   CREATININE 1.33* 0.91 0.59 0.55  CALCIUM 8.6* 8.2*  --   --   AST 32  --   --   --   ALT 18  --   --   --   ALKPHOS 116  --   --   --   BILITOT 1.3*  --   --   --    ------------------------------------------------------------------------------------------------------------------ estimated creatinine clearance is 52.1 mL/min (by C-G formula based on Cr of 0.55). ------------------------------------------------------------------------------------------------------------------ No results for input(s): HGBA1C in the last 72 hours. ------------------------------------------------------------------------------------------------------------------ No results for input(s): CHOL, HDL, LDLCALC, TRIG, CHOLHDL, LDLDIRECT in the last 72 hours. ------------------------------------------------------------------------------------------------------------------  Recent Labs  04/19/15 0554  T4TOTAL 6.2  T3FREE 1.4*   ------------------------------------------------------------------------------------------------------------------ No results for input(s): VITAMINB12, FOLATE, FERRITIN, TIBC, IRON, RETICCTPCT in the last 72 hours.  Coagulation profile  Recent Labs Lab 04/17/15 0544  INR 1.21    No results for input(s): DDIMER in the last 72 hours.  Cardiac Enzymes  Recent Labs Lab 04/17/15 0337 04/17/15 0731 04/17/15 1352  TROPONINI 0.42* 0.40* 0.24*   ------------------------------------------------------------------------------------------------------------------ Invalid input(s): POCBNP    Assessment & Plan   1. Acute encephalopathy.  Could be secondary to dehydration. No source of infection at this time. Continue IVF 2. Paroxysmal atrial fibrillation. On anticoagulation with Eliquis. Rate control with Cardizem. 3. Panhypopituitarism with macroadenoma  Continue hydrocortisone and levothyroxine 4. Hypothyroidism unspecified continue levothyroxine 5. Hyperlipidemia unspecified continue atorvastatin 6. Elevated troponin likely demand ischemia 7. Misc: lovenox for dvt proph  Code Status History    Date Active Date Inactive Code Status Order ID Comments User Context   04/17/2015 12:22 AM 04/20/2015  3:55 PM DNR [id number]  Ihor Austin, MD ED   04/10/2015 10:35 PM 04/13/2015  5:34 PM DNR [id number]  Shaune Pollack, MD Inpatient   09/07/2014  1:04 AM 09/07/2014  5:15 PM  Full Code [id number]  Oralia Manisavid Willis, MD Inpatient    Questions for Most Recent Historical Code Status (Order [id number])    Question Answer Comment   In the event of cardiac or respiratory ARREST Do not call a "code blue"    In the event of cardiac or respiratory ARREST Do not perform Intubation, CPR, defibrillation or ACLS    In the event of cardiac or respiratory ARREST Use medication by any route, position, wound care, and other measures to relive pain and suffering. May use oxygen, suction and manual treatment of airway obstruction as needed for comfort.            Consults  none  DVT Prophylaxis  Lovenox   Lab Results  Component Value Date   PLT 278 04/20/2015     Time Spent in minutes   45  Greater than 50% of time spent in care coordination Auburn BilberryPATEL, Jerone Cudmore M.D on 04/21/2015 at 10:38 AM  Between 7am to 6pm - Pager - [phone]  After 6pm go to [email or web address] - password [email or web address] [facility]  [facility]   Office  [phone]

## 2015-05-07 ENCOUNTER — Telehealth: Payer: Self-pay | Admitting: Family Medicine

## 2015-05-07 NOTE — Telephone Encounter (Signed)
faxed

## 2015-05-07 NOTE — Telephone Encounter (Signed)
Hospice is okay   Expand All Collapse All   Debra with [facility] called stated the pt's son Leonard Downingllen Dowd called and is requesting hospice services for his mother. Stanton KidneyDebra stated that if Dr. Dossie Arbourrissman agrees with this please send a referral, demographics, last office note, and an order. Please fax this info to # [phone]. Thanks.

## 2015-05-07 NOTE — Telephone Encounter (Signed)
Debra with [facility] called stated the pt's son Leonard Downingllen Dowd called and is requesting hospice services for his mother. Stanton KidneyDebra stated that if Dr. Dossie Arbourrissman agrees with this please send a referral, demographics, last office note, and an order. Please fax this info to # [phone]. Thanks.

## 2015-05-14 ENCOUNTER — Ambulatory Visit: Payer: Self-pay | Admitting: Cardiovascular Disease

## 2015-05-18 DEATH — deceased

## 2015-09-29 ENCOUNTER — Ambulatory Visit: Payer: Medicare Other | Admitting: Family Medicine

## 2016-11-01 IMAGING — CR DG CHEST 2V
3 series · 3 of 3 positions shown · non-contrast
Comparison: Portable chest x-ray November 27, 2012

CLINICAL DATA: Preop exam prior to ORIF of a right humeral
fracture.

EXAM:
CHEST  2 VIEW

[chest pa (1 of 2)]
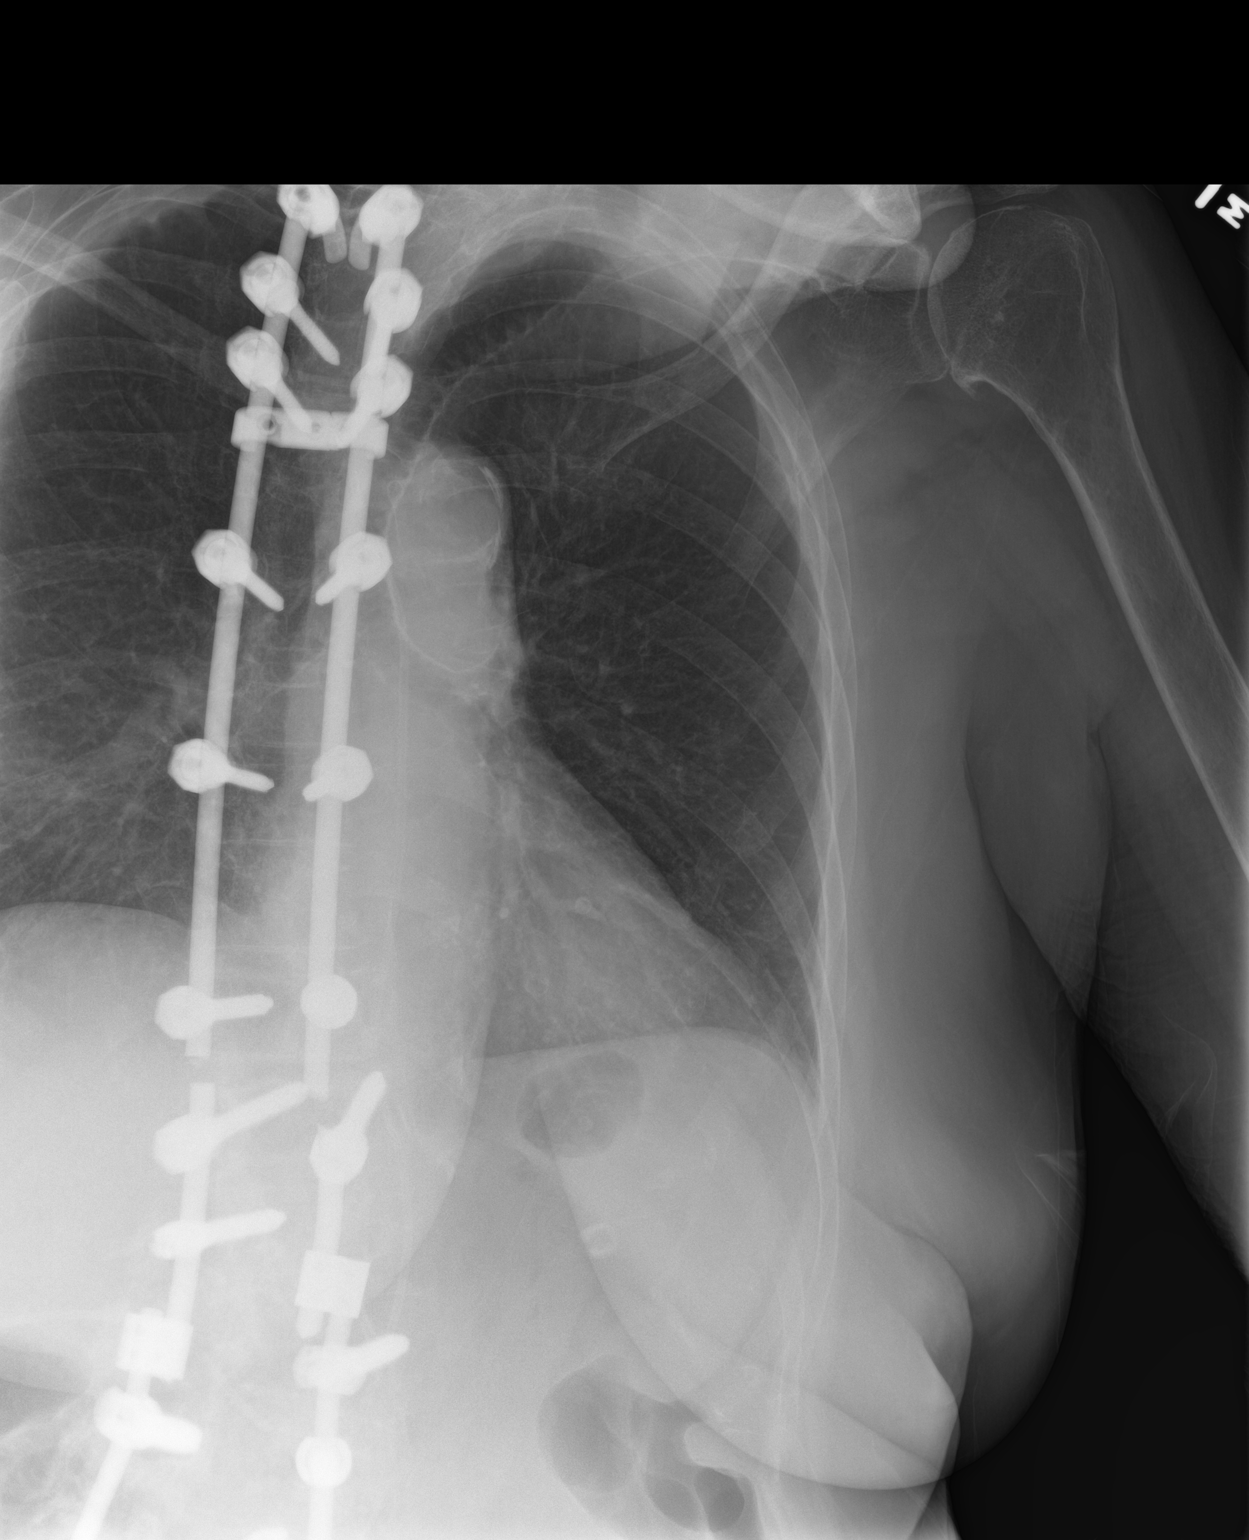

[chest lat]
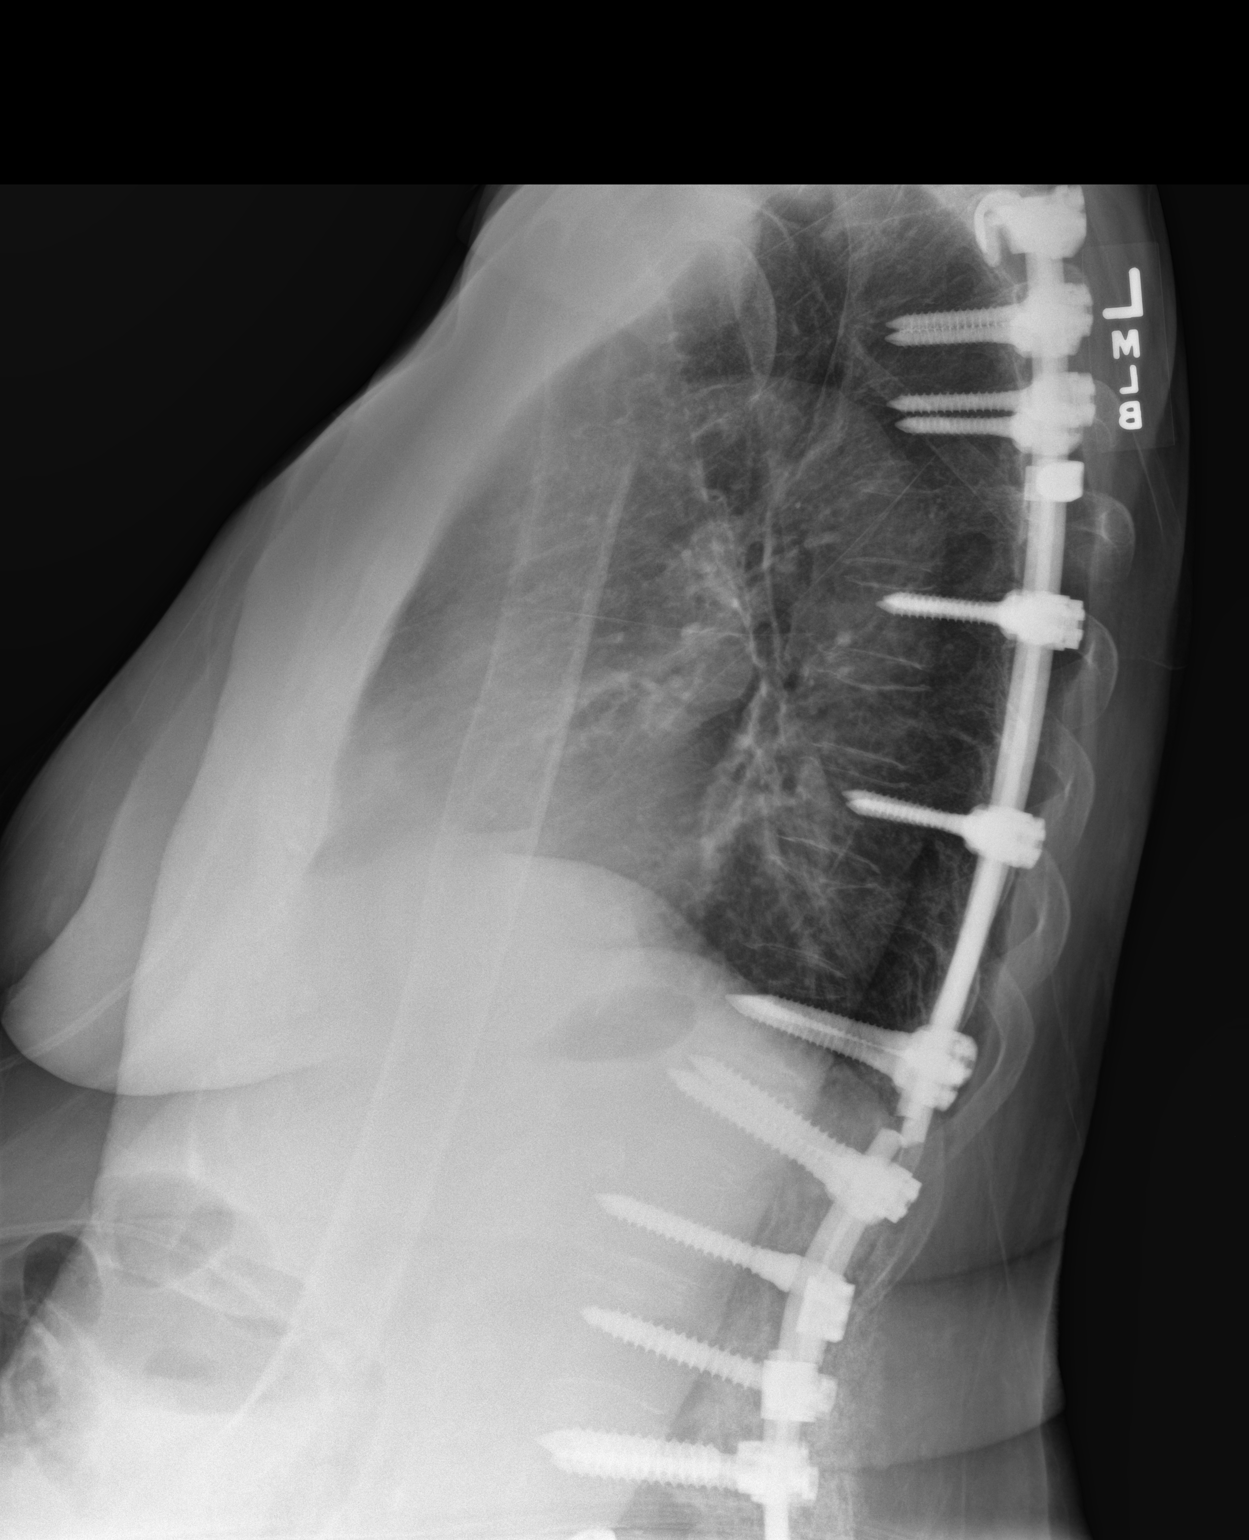

[chest pa (2 of 2)]
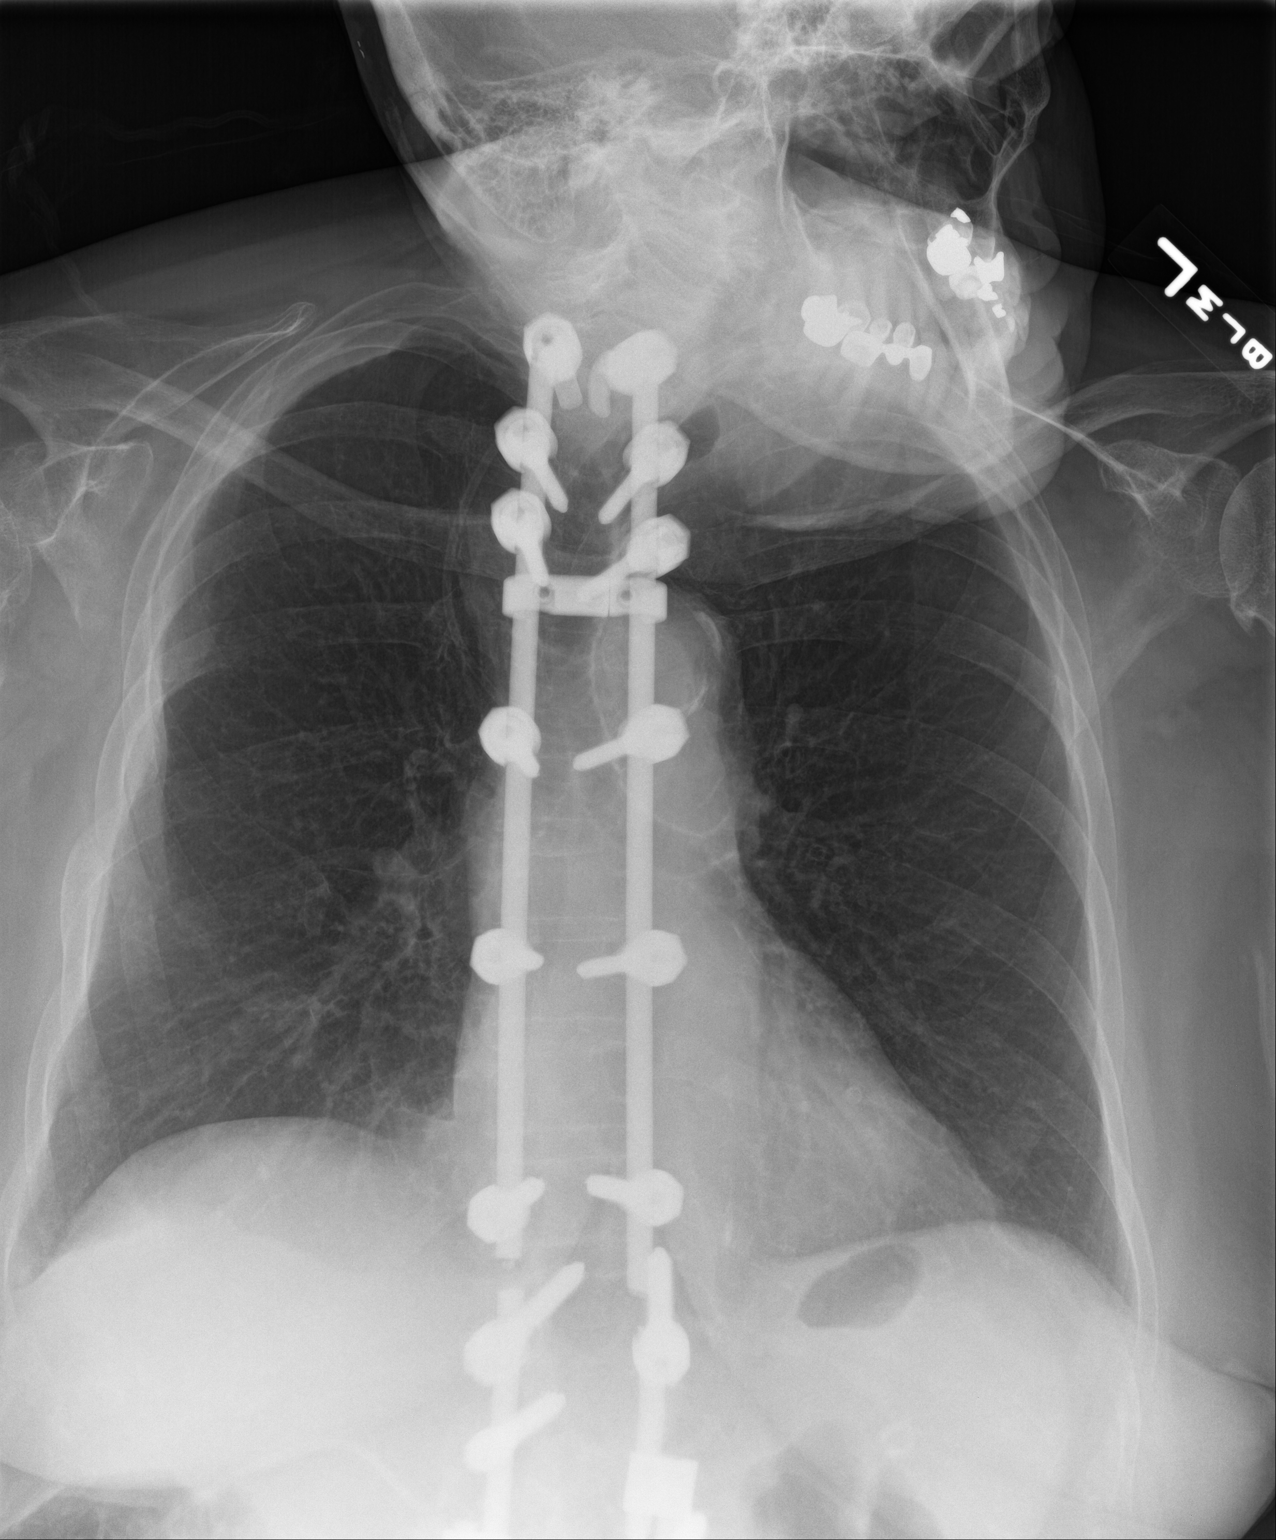

[3 of 3 positions shown; findings below may reference images not displayed]

FINDINGS: The lungs are well-expanded and clear. The heart and pulmonary
vascularity are normal. There is no pleural effusion. There is
stable apical pleural thickening on the right. There are multiple
lateral rib deformities on the right. The age of these is unclear.
The Harrington rods appear unchanged throughout the thoracic spine.
IMPRESSION: 1. COPD. No pulmonary contusion, pleural effusion, or pneumothorax
is demonstrated. There is no evidence of CHF.
2. There are rib deformities involving the fourth through sixth ribs
on the right laterally. The age of these is unclear. Correlation
with any acute chest wall symptoms is needed to exclude the presence
of acute rib fractures.

## 2017-11-25 IMAGING — CR DG ABDOMEN ACUTE W/ 1V CHEST
1 series · 4 of 4 positions shown · non-contrast
Comparison: 02/23/2015

CLINICAL DATA: Abdominal pain.  Found unresponsive.

EXAM:
DG ABDOMEN ACUTE W/ 1V CHEST

[Series 1: dg abd acute w/chest · 0.14mm/px · 4 of 4 slices shown]
[im 1/4]
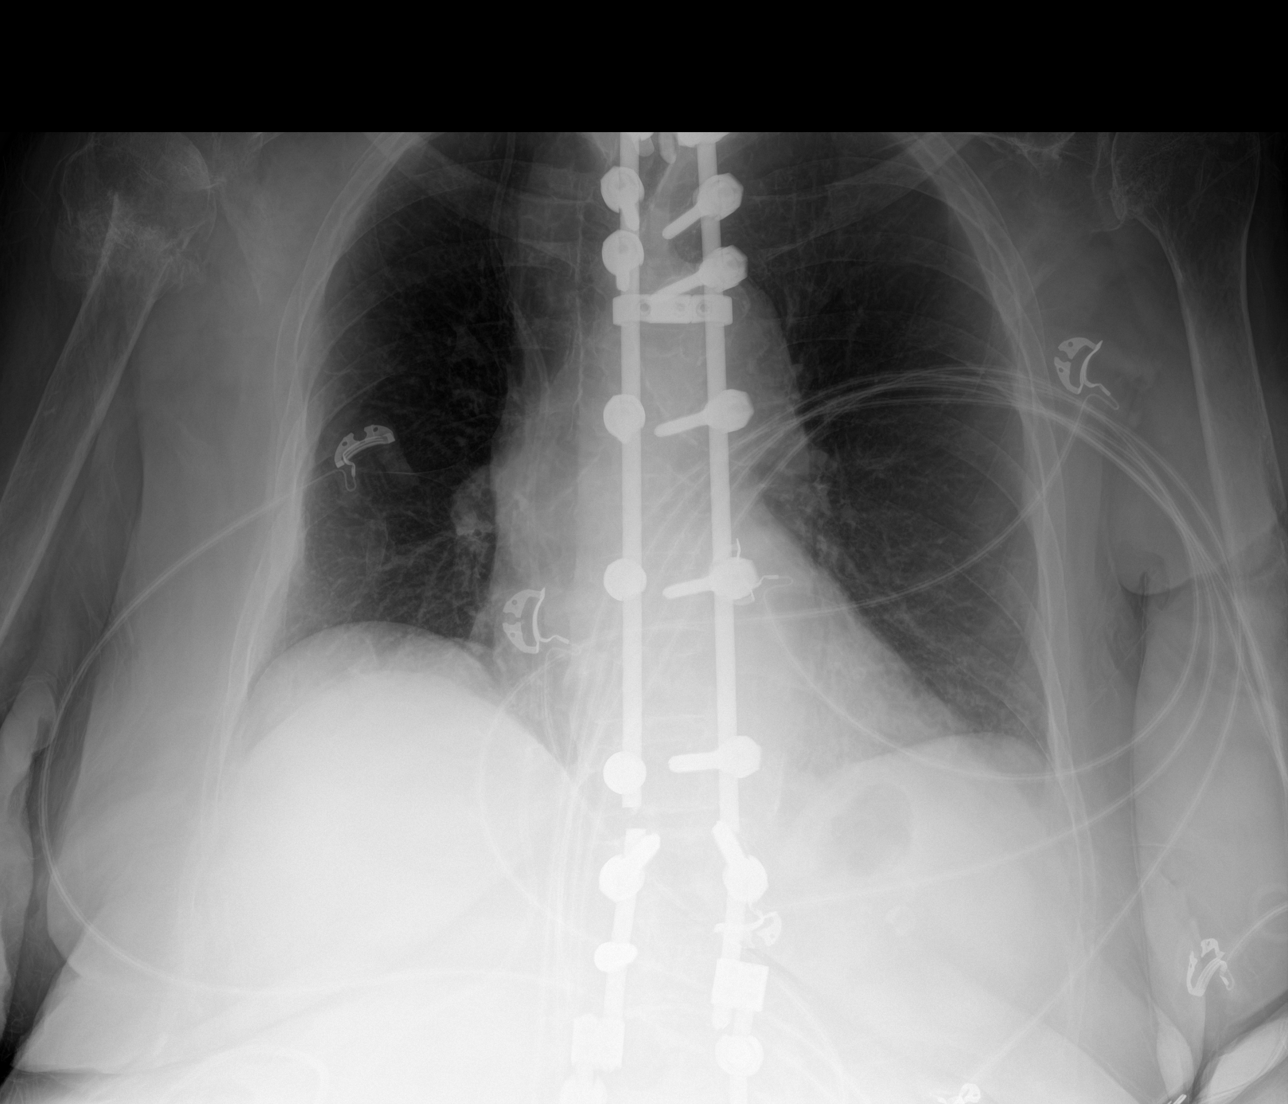
[im 2/4]
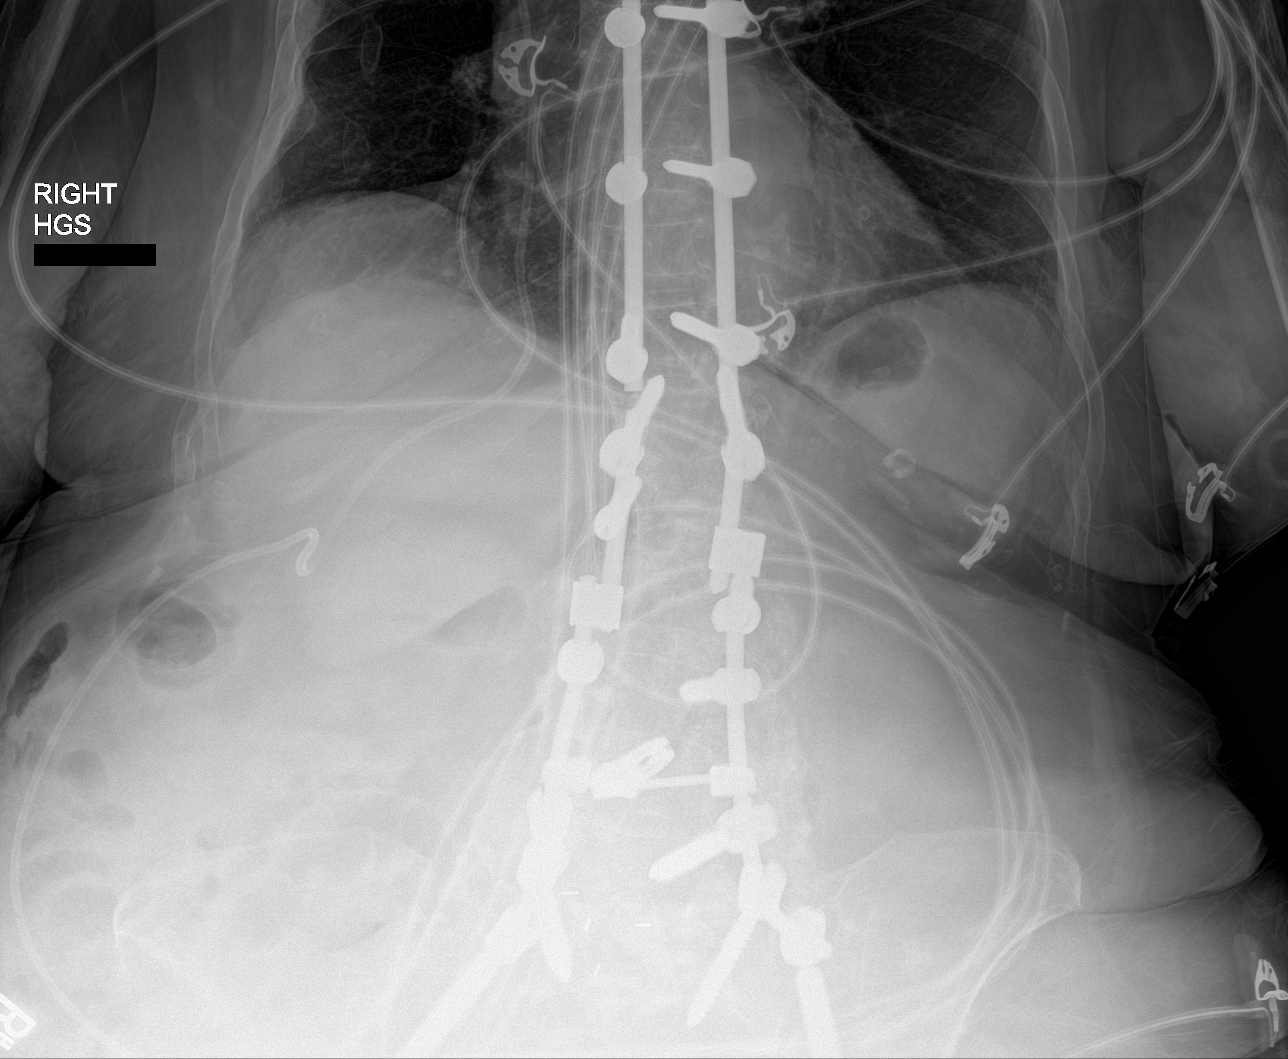
[im 3/4]
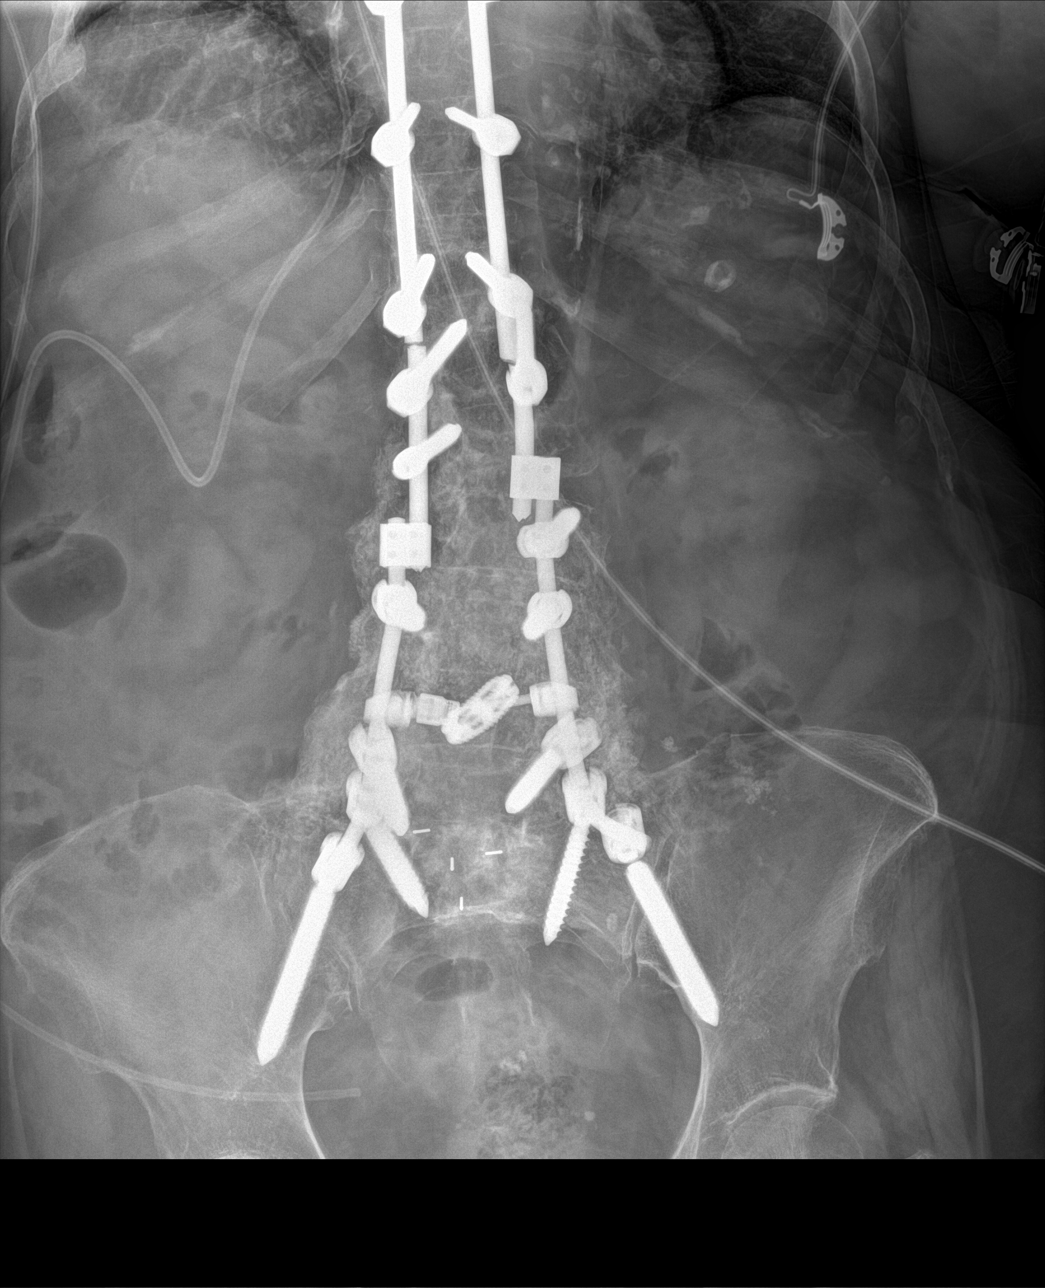
[im 4/4]
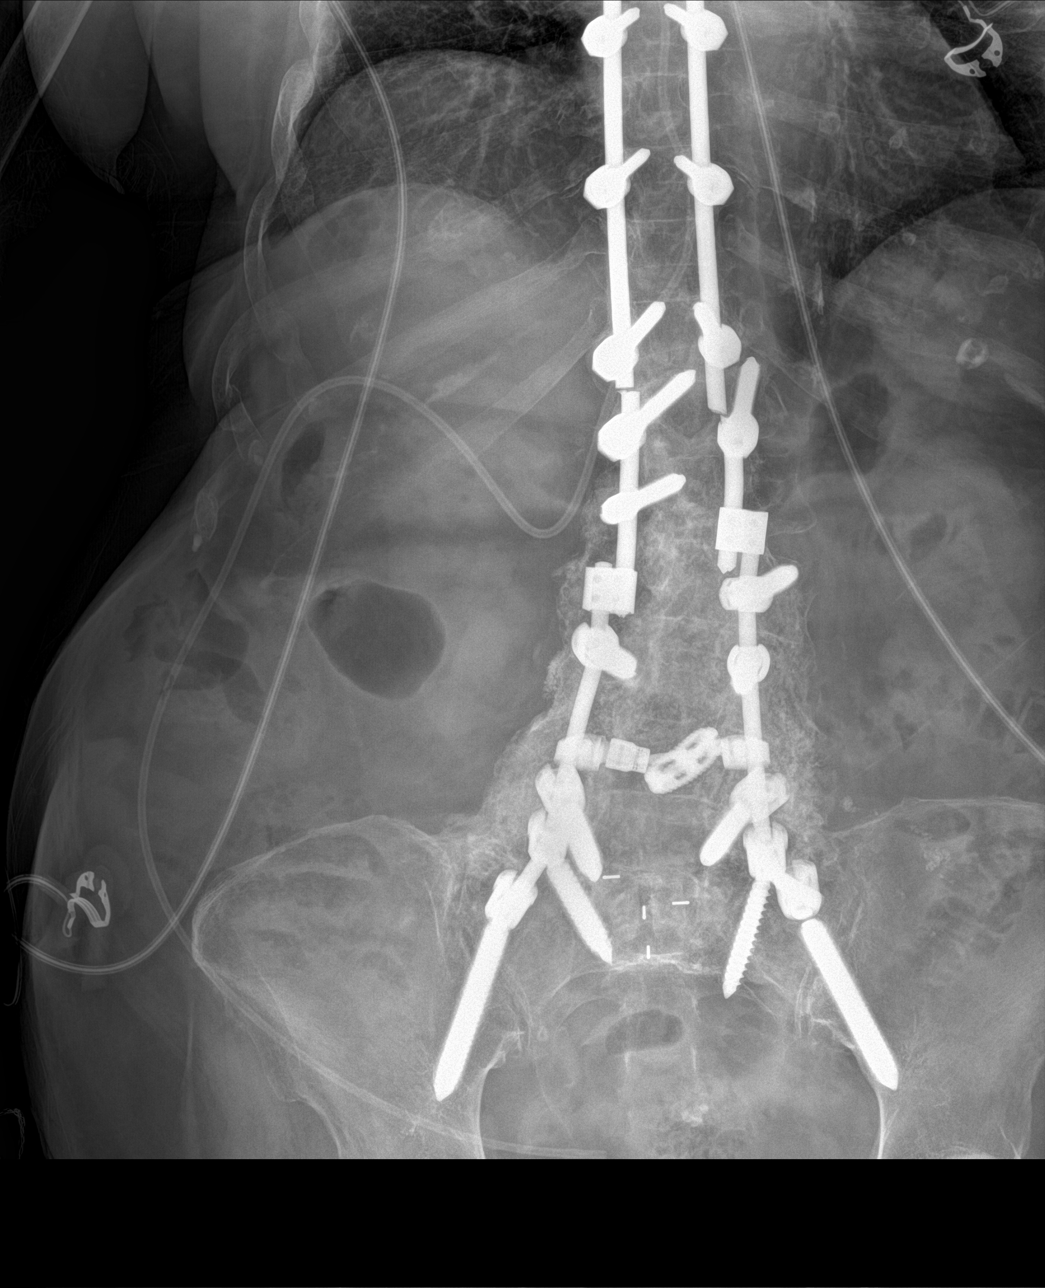

[4 of 4 positions shown; findings below may reference images not displayed]

FINDINGS: Again noted is a right VP shunt. Lungs are clear. Heart and
mediastinum are stable. Again noted are surgical rods extending from
the thoracic spine to the pelvis. Incidentally, the screw extending
into the left ilium is broken. Again noted are broken or cut rods in
the thoracolumbar junction. No evidence for free air. Nonobstructive
bowel gas pattern. Small amount of stool in the pelvis. Phleboliths
in the pelvis. VP catheter terminates in the pelvic region. Old
displaced fracture of the proximal right humerus.
IMPRESSION: No acute chest abnormality.

No acute abdominal findings.

Surgical hardware as described.

## 2017-11-25 IMAGING — CT CT HEAD W/O CM
1 series · 14 of 30 positions shown, 18 images · non-contrast
Comparison: September 06, 2014

CLINICAL DATA: Acute mental status change.  History of dementia.

EXAM:
CT HEAD WITHOUT CONTRAST
TECHNIQUE: Contiguous axial images were obtained from the base of the skull
through the vertex without intravenous contrast.

[Series 2: soft tissue · axial · 0.42mm/px · z∈[+226,+362]mm · 14 of 32 slices shown, 18 images]
[im 3/32  brain]
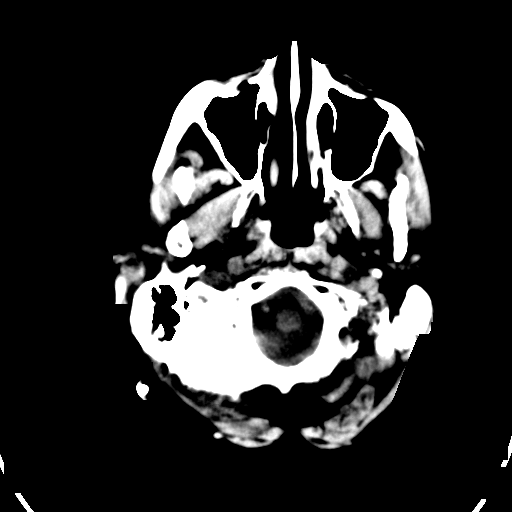
[im 3/32  bone]
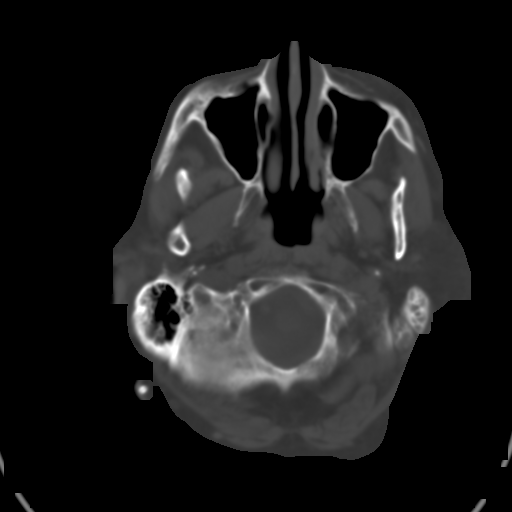
[im 5/32  brain]
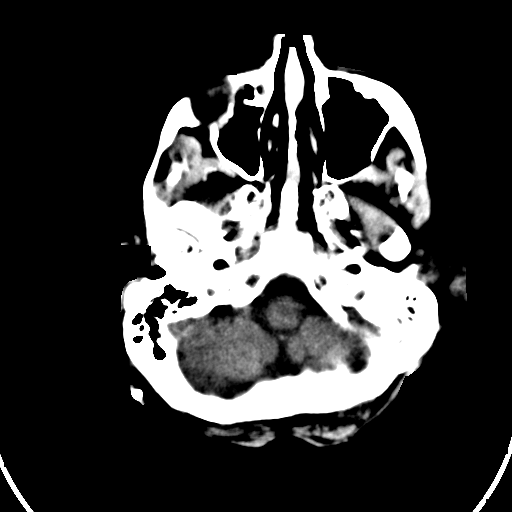
[im 7/32  brain]
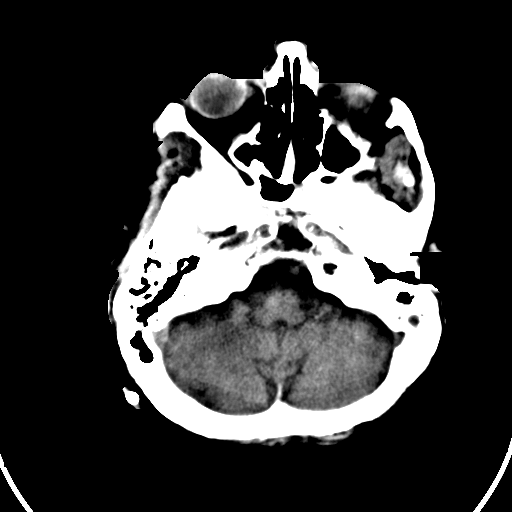
[im 9/32  brain]
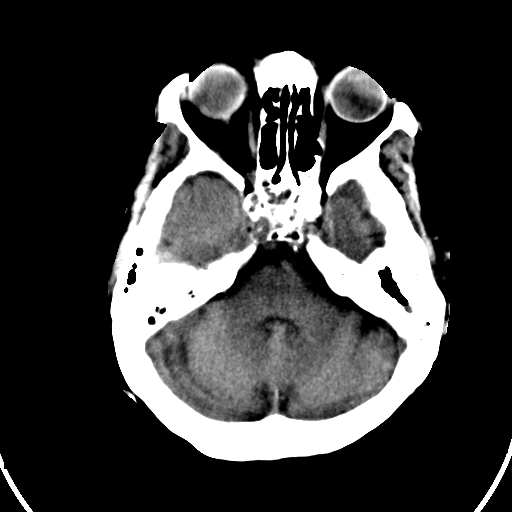
[im 11/32  brain]
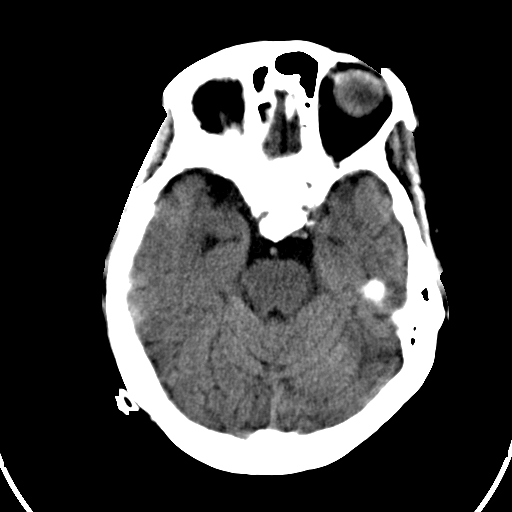
[im 11/32  bone]
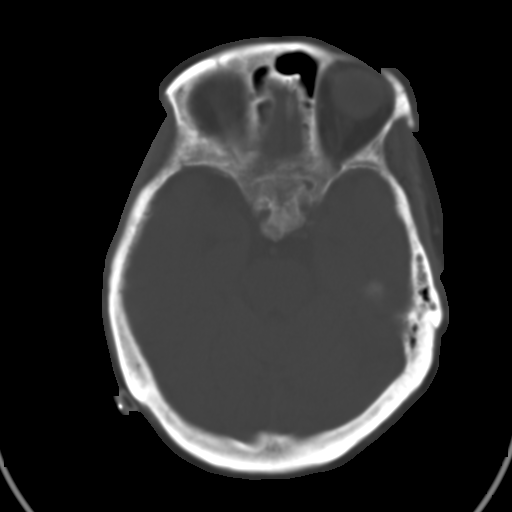
[im 13/32  brain]
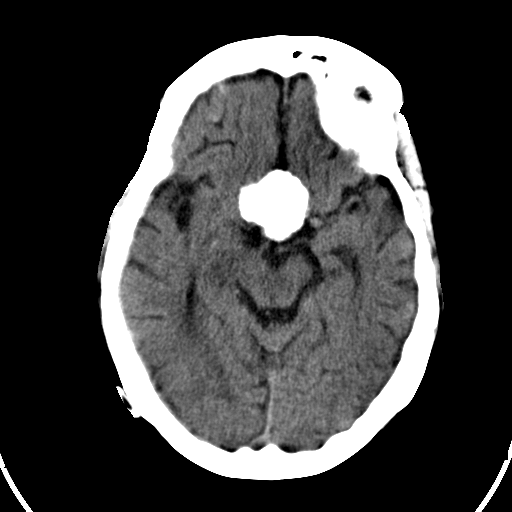
[im 15/32  brain]
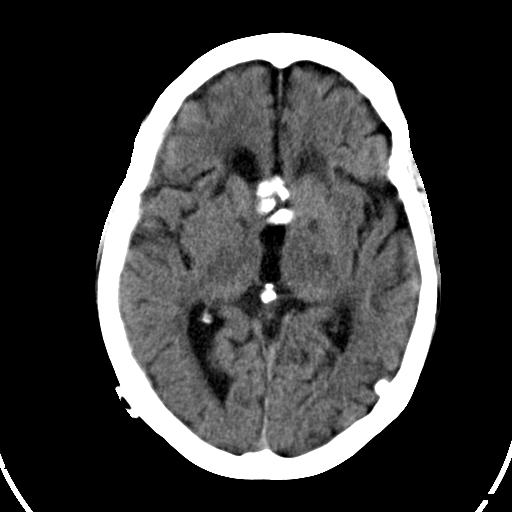
[im 18/32  brain]
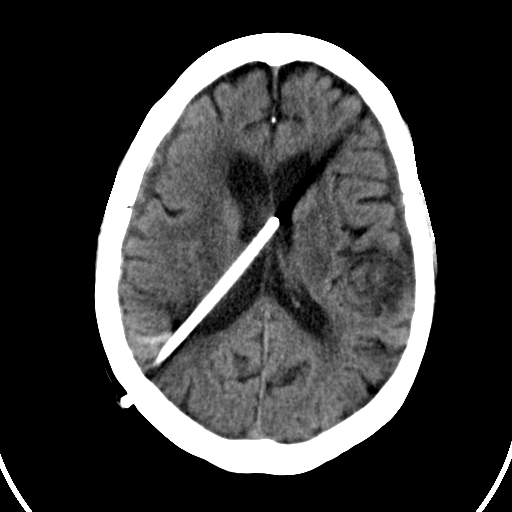
[im 20/32  brain]
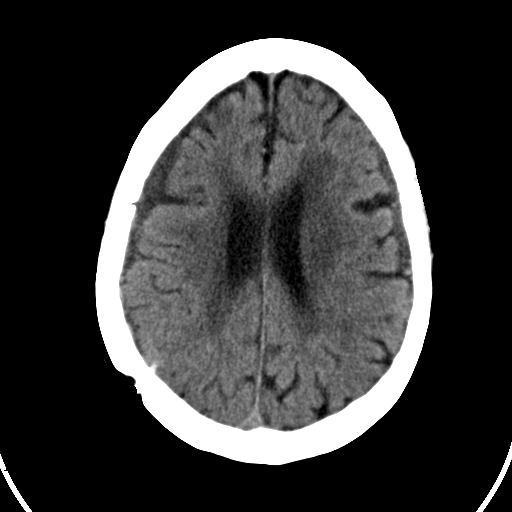
[im 20/32  bone]
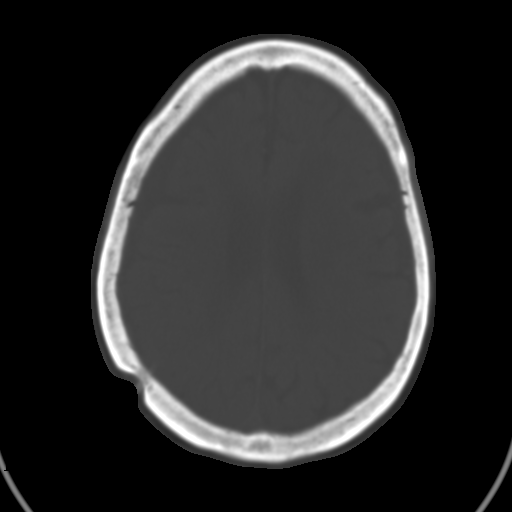
[im 22/32  brain]
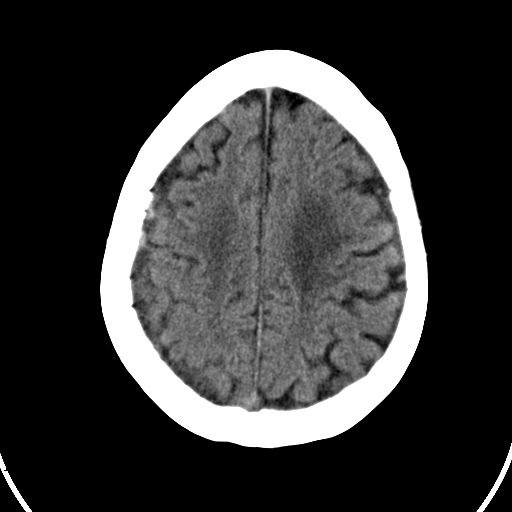
[im 24/32  brain]
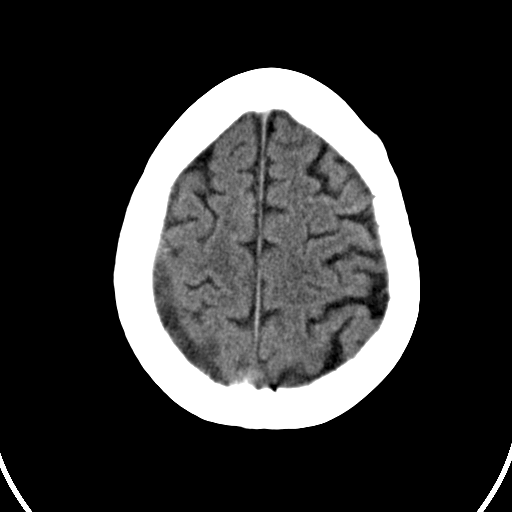
[im 26/32  brain]
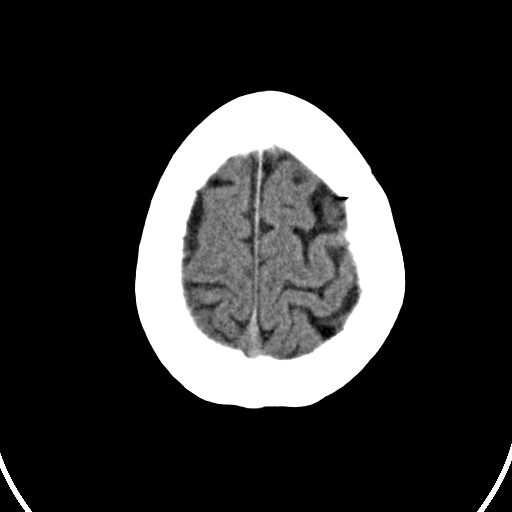
[im 28/32  brain]
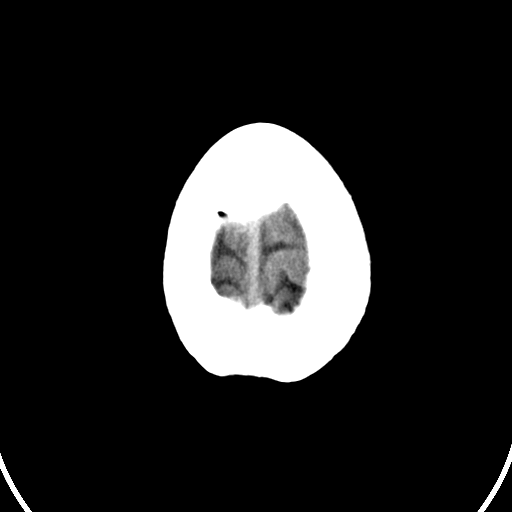
[im 28/32  bone]
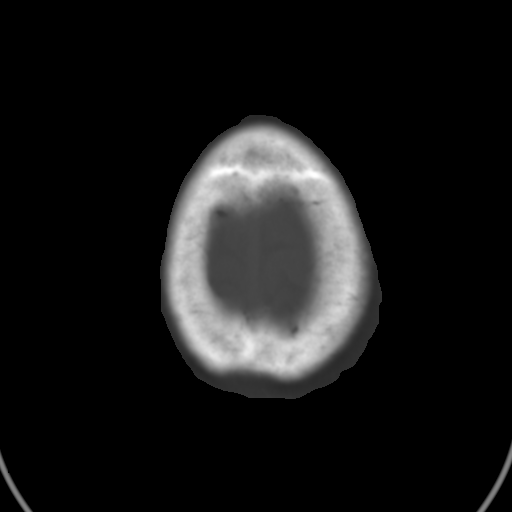
[im 30/32  brain]
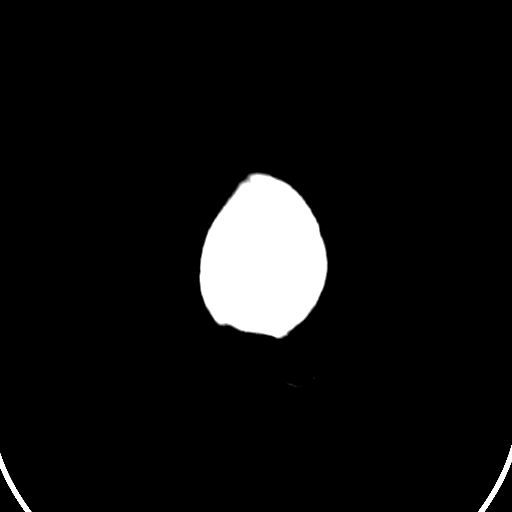

[14 of 30 positions shown; findings below may reference images not displayed]

FINDINGS: Opacification of the left sphenoid sinus is stable. The remainder of
the paranasal sinuses are normal. There is opacification of
scattered right mastoid air cells. There is more significant
opacification and fluid in left mastoid air cells which is increased
in the interval. There is no associated bony erosion or overlying
soft tissue thickening. The middle ears are well aerated. The
remainder of the bones are intact. Extracranial soft tissues are
within normal limits. The base of skull/ sella meningioma is
unchanged. A VP shunt remains in place, entering from a right-sided
approach and terminating just to the left of midline, stable. The
ventricles are unchanged in size with no new hydrocephalus. No
midline shift. Moderate to severe white matter changes again seen
with scattered lacunar infarcts in the basal ganglia, stable in the
interval. No acute cortical ischemia or infarct is identified. There
is a right subdural extra-axial fluid collection, not seen on the
previous study. Much of this fluid collection is low in attenuation
consistent with a chronic subdural bleed. However, there is high
attenuation as well consistent with an acute component of blood.
There is also a similar appearance but less prominent on the left.
Some mild high attenuation adjacent to the VP shunt as it enters the
skull as seen on series 2, image 19 is identified. This is favored
to be artifactual rather than additional acute blood. No blood
within the ventricles. The cerebellum and brainstem are normal. The
basal cisterns are unchanged with no acute effacement. New
IMPRESSION: 1. Bilateral small subdural hematomas with acute and chronic
components, right greater than left. The thickest area of subdural
fluid is seen on the right on image 20 measuring up to 9 mm of
largely chronic component. There is no midline shift. High
attenuation adjacent to the VP shunt as it enters the skull is
favored to be artifactual. A small amount of adjacent acute blood is
considered less likely. Recommend attention on follow-up.
2. Chronic white matter changes and lacunar infarcts. No evidence of
acute ischemia or infarct.
3. Increasing fluid in the mastoid air cells without bony erosion or
overlying soft tissue thickening.
Findings discussed with Dr. Jiquinlaca at the time of dictation.
